# Patient Record
Sex: Female | Born: 1990
Health system: Southern US, Community
[De-identification: ages and names within clinical notes are randomized; demographics above are authoritative.]

## PROBLEM LIST (undated history)

## (undated) DIAGNOSIS — H109 Unspecified conjunctivitis: Secondary | ICD-10-CM

## (undated) DIAGNOSIS — K219 Gastro-esophageal reflux disease without esophagitis: Secondary | ICD-10-CM

## (undated) HISTORY — DX: Gastro-esophageal reflux disease without esophagitis: K21.9

## (undated) HISTORY — DX: Unspecified conjunctivitis: H10.9

---
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
## 2015-09-29 MED FILL — MONO-LINYAH 28 TABLET: 0.25-35 | 84 days supply | Qty: 112 | Fill #2

## 2016-01-13 MED FILL — MONO-LINYAH 28 TABLET: 0.25-35 | 84 days supply | Qty: 112 | Fill #3

## 2016-03-22 LAB — HM PAP SMEAR

## 2016-04-19 DIAGNOSIS — Z01419 Encounter for gynecological examination (general) (routine) without abnormal findings: Secondary | ICD-10-CM | POA: Diagnosis not present

## 2016-04-19 DIAGNOSIS — Z681 Body mass index (BMI) 19 or less, adult: Secondary | ICD-10-CM | POA: Diagnosis not present

## 2016-04-19 MED FILL — MONO-LINYAH 28 TABLET: 0.25-35 | 84 days supply | Qty: 112 | Fill #0

## 2016-08-12 MED FILL — MONO-LINYAH 28 TABLET: 0.25-35 | 84 days supply | Qty: 112 | Fill #1

## 2016-09-11 DIAGNOSIS — H10021 Other mucopurulent conjunctivitis, right eye: Secondary | ICD-10-CM | POA: Diagnosis not present

## 2016-09-21 DIAGNOSIS — H5202 Hypermetropia, left eye: Secondary | ICD-10-CM | POA: Diagnosis not present

## 2016-09-21 DIAGNOSIS — H5211 Myopia, right eye: Secondary | ICD-10-CM | POA: Diagnosis not present

## 2016-09-30 ENCOUNTER — Encounter: Payer: Self-pay | Admitting: Behavioral Health

## 2016-09-30 ENCOUNTER — Telehealth: Payer: Self-pay | Admitting: Behavioral Health

## 2016-09-30 NOTE — Telephone Encounter (Signed)
Pre-Visit Call completed with patient and chart updated.   Pre-Visit Info documented in Specialty Comments under SnapShot.    

## 2016-10-03 ENCOUNTER — Ambulatory Visit: Payer: Self-pay | Admitting: Family

## 2016-10-14 ENCOUNTER — Encounter: Payer: Self-pay | Admitting: Family

## 2016-10-14 ENCOUNTER — Ambulatory Visit (INDEPENDENT_AMBULATORY_CARE_PROVIDER_SITE_OTHER): Payer: 59 | Admitting: Family

## 2016-10-14 VITALS — BP 115/70 | HR 63 | Temp 98.4°F | Resp 16 | Ht 67.0 in | Wt 112.2 lb

## 2016-10-14 DIAGNOSIS — Z23 Encounter for immunization: Secondary | ICD-10-CM

## 2016-10-14 DIAGNOSIS — Z0000 Encounter for general adult medical examination without abnormal findings: Secondary | ICD-10-CM

## 2016-10-14 LAB — CBC WITH DIFFERENTIAL/PLATELET
BASOS PCT: 0.4 % (ref 0.0–3.0)
Basophils Absolute: 0 10*3/uL (ref 0.0–0.1)
EOS ABS: 0.1 10*3/uL (ref 0.0–0.7)
EOS PCT: 1.6 % (ref 0.0–5.0)
HEMATOCRIT: 36.9 % (ref 36.0–46.0)
HEMOGLOBIN: 12.6 g/dL (ref 12.0–15.0)
Lymphocytes Relative: 32.3 % (ref 12.0–46.0)
Lymphs Abs: 2.4 10*3/uL (ref 0.7–4.0)
MCHC: 34.1 g/dL (ref 30.0–36.0)
MCV: 93.2 fl (ref 78.0–100.0)
MONO ABS: 0.5 10*3/uL (ref 0.1–1.0)
Monocytes Relative: 6.3 % (ref 3.0–12.0)
Neutro Abs: 4.4 10*3/uL (ref 1.4–7.7)
Neutrophils Relative %: 59.4 % (ref 43.0–77.0)
Platelets: 336 10*3/uL (ref 150.0–400.0)
RBC: 3.95 Mil/uL (ref 3.87–5.11)
RDW: 12.6 % (ref 11.5–15.5)
WBC: 7.3 10*3/uL (ref 4.0–10.5)

## 2016-10-14 LAB — BASIC METABOLIC PANEL
BUN: 8 mg/dL (ref 6–23)
CHLORIDE: 106 meq/L (ref 96–112)
CO2: 28 mEq/L (ref 19–32)
Calcium: 9.3 mg/dL (ref 8.4–10.5)
Creatinine, Ser: 0.62 mg/dL (ref 0.40–1.20)
GFR: 122.89 mL/min (ref >60.00)
Glucose, Bld: 79 mg/dL (ref 70–99)
POTASSIUM: 4.1 meq/L (ref 3.5–5.1)
Sodium: 138 mEq/L (ref 135–145)

## 2016-10-14 LAB — HEPATIC FUNCTION PANEL
ALT: 9 U/L (ref 0–35)
AST: 13 U/L (ref 0–37)
Albumin: 4.2 g/dL (ref 3.5–5.2)
Alkaline Phosphatase: 41 U/L (ref 39–117)
BILIRUBIN DIRECT: 0.1 mg/dL (ref 0.0–0.3)
BILIRUBIN TOTAL: 0.8 mg/dL (ref 0.2–1.2)
Total Protein: 7.5 g/dL (ref 6.0–8.3)

## 2016-10-14 LAB — TSH: TSH: 1.17 u[IU]/mL (ref 0.35–4.50)

## 2016-10-14 LAB — LIPID PANEL
CHOL/HDL RATIO: 3
Cholesterol: 153 mg/dL (ref 0–200)
HDL: 55.1 mg/dL (ref >39.00)
LDL CALC: 86 mg/dL (ref 0–99)
NonHDL: 98.3
Triglycerides: 62 mg/dL (ref 0.0–149.0)
VLDL: 12.4 mg/dL (ref 0.0–40.0)

## 2016-10-14 NOTE — Progress Notes (Signed)
Subjective:    Patient ID: Kim Wheeler, female    DOB: [birth year], 26 y.o.   MRN: [id number]  HPI   Patient presents today for complete physical.  Immunizations:  Flu shot up to date.  Unsure of last tetanus. Diet:  Fair diet Exercise: does not exercise.  Pap Smear: 8/17 normal per patient sees Kim Wheeler        Review of Systems  Constitutional: Negative for unexpected weight change.  HENT: Negative for hearing loss and rhinorrhea.   Eyes: Negative for visual disturbance.  Respiratory: Negative for cough.   Cardiovascular: Negative for leg swelling.  Gastrointestinal: Negative for constipation and diarrhea.  Genitourinary: Negative for dysuria and frequency.  Musculoskeletal: Negative for arthralgias and myalgias.  Skin: Negative for rash.  Neurological: Negative for headaches.  Hematological: Negative for adenopathy.  Psychiatric/Behavioral:       Denies depression/anxiety   Past Medical History:  Diagnosis Date  . Conjunctivitis      Social History   Social History  . Marital status: Single    Spouse name: N/A  . Number of children: N/A  . Years of education: N/A   Occupational History  . Not on file.   Social History Main Topics  . Smoking status: Never Smoker  . Smokeless tobacco: Never Used  . Alcohol use Yes     Comment: Wine once a month   . Drug use: No  . Sexual activity: Not on file   Other Topics Concern  . 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 EYE SURGERY      Family History  Problem Relation Age of Onset  . Cancer Mother     breast cancer  . Hypertension Mother   . Gout Father   . Dementia Maternal Grandmother   . Stroke Maternal Grandmother   . Breast cancer Maternal Grandmother     No Known  Allergies  Current Outpatient Prescriptions on File Prior to Visit  Medication Sig Dispense Refill  . norgestimate-ethinyl estradiol (SPRINTEC 28) 0.25-35 MG-MCG tablet Take 1 tablet by mouth daily.     No current facility-administered medications on file prior to visit.     BP 115/70 (BP Location: Right Arm, Patient Position: Sitting, Cuff Size: Normal)   Pulse 63   Temp 98.4 F (36.9 C) (Oral)   Resp 16   Ht 5\' 7"  (1.702 m)   Wt 112 lb 3.2 oz (50.9 kg)   LMP 10/11/2016   SpO2 99%   BMI 17.57 kg/m       Objective:   Physical Exam  Physical Exam  Constitutional: She is oriented to person, place, and time. She appears well-developed and well-nourished. No distress.  HENT:  Head: Normocephalic and atraumatic.  Right Ear: Tympanic membrane and ear canal normal.  Left Ear: Tympanic membrane and ear canal normal.  Mouth/Throat: Oropharynx is clear and moist.  Eyes: Pupils are equal, round, and reactive to light. No scleral icterus.  Neck: Normal range of motion. No thyromegaly present.  Cardiovascular: Normal rate and regular rhythm.   No murmur heard. Pulmonary/Chest: Effort normal and breath sounds normal. No respiratory distress. He has no wheezes. She has no rales. She exhibits no tenderness.  Abdominal: Soft. Bowel sounds are normal. She exhibits no distension and no mass. There is no tenderness. There is no rebound and no guarding.  Musculoskeletal: She exhibits no edema.  Lymphadenopathy:    She has no cervical adenopathy.  Neurological: She is alert and oriented to person, place, and time. She has normal patellar reflexes. She exhibits normal muscle tone. Coordination normal.  Skin: Skin is warm and dry.  Psychiatric: She has a normal mood and affect. Her behavior is normal. Judgment and thought content normal.  Breast/pelvic: deferred            Assessment & Plan:          Assessment & Plan:  Preventative Care- discussed healthy diet, regular  exercise. Will give Tdap today.  Obtain routine lab work.

## 2016-10-14 NOTE — Progress Notes (Signed)
Pre visit review using our clinic review tool, if applicable. No additional management support is needed unless otherwise documented below in the visit note. 

## 2016-10-14 NOTE — Patient Instructions (Addendum)
Please add regular exercise. Complete lab work prior to leaving.

## 2016-10-21 ENCOUNTER — Encounter: Payer: Self-pay | Admitting: Family

## 2016-11-04 NOTE — Telephone Encounter (Signed)
Message sent to lab to check status of u/a from 10/14/16.

## 2016-11-04 NOTE — Telephone Encounter (Signed)
Did lab get urinalysis sample? If not, please ask pt to return at her convenience.

## 2016-12-02 MED FILL — MONO-LINYAH 28 TABLET: 0.25-35 | 84 days supply | Qty: 112 | Fill #2

## 2017-03-07 MED FILL — MONO-LINYAH 28 TABLET: 0.25-35 | 84 days supply | Qty: 112 | Fill #3

## 2017-06-26 DIAGNOSIS — Z681 Body mass index (BMI) 19 or less, adult: Secondary | ICD-10-CM | POA: Diagnosis not present

## 2017-06-26 DIAGNOSIS — Z01419 Encounter for gynecological examination (general) (routine) without abnormal findings: Secondary | ICD-10-CM | POA: Diagnosis not present

## 2017-07-21 MED FILL — NORG-ETHIN ESTRA 0.25-0.035: 0.25-35 | 84 days supply | Qty: 112 | Fill #0

## 2017-08-13 ENCOUNTER — Encounter: Payer: Self-pay | Admitting: Family

## 2017-08-18 ENCOUNTER — Telehealth: Payer: Self-pay

## 2017-08-18 DIAGNOSIS — Z111 Encounter for screening for respiratory tuberculosis: Secondary | ICD-10-CM

## 2017-08-18 NOTE — Addendum Note (Signed)
Addended byConrad Inman: Jasia Hiltunen D on: 08/18/2017 12:18 PM   Modules accepted: Orders

## 2017-08-18 NOTE — Telephone Encounter (Signed)
Please advise in PCP absence.  

## 2017-08-18 NOTE — Telephone Encounter (Signed)
OK to order quant gold test. Ty.

## 2017-08-18 NOTE — Telephone Encounter (Signed)
Order placed- Fleeta EmmerShanea will you call Pt to schedule lab appt please?

## 2017-08-18 NOTE — Telephone Encounter (Signed)
Copied from CRM [id number]. Topic: Appointment Scheduling - Scheduling Inquiry for Clinic >> Aug 18, 2017 11:27 AM Arlyss Gandyichardson, Taren N, NT wrote: Reason for CRM: Patient is needing a TB test done. She is requesting the blood draw TB test done. Needs order put in the appt scheduled. Please contact pt.

## 2017-08-21 ENCOUNTER — Telehealth: Payer: Self-pay | Admitting: Family

## 2017-08-21 ENCOUNTER — Other Ambulatory Visit (INDEPENDENT_AMBULATORY_CARE_PROVIDER_SITE_OTHER): Payer: 59

## 2017-08-21 DIAGNOSIS — Z0000 Encounter for general adult medical examination without abnormal findings: Secondary | ICD-10-CM

## 2017-08-21 DIAGNOSIS — Z111 Encounter for screening for respiratory tuberculosis: Secondary | ICD-10-CM | POA: Diagnosis not present

## 2017-08-21 NOTE — Telephone Encounter (Signed)
Notified pt. She is available on 09/01/16. Appt scheduled at 10am. Lab order entered for U/A. Pt denies any known colorblindness. I entered cpe dx for U/A. Please advise if there is another dx code you would like me to use?

## 2017-08-21 NOTE — Telephone Encounter (Signed)
Please contact pt and let her know that I reviewed her cpx form.  It is requiring UA, hearing, vision check.  We can schedule her for a nurse visit to complete these things and then I can complete her form.  Also need to confirm that she is not color blind.

## 2017-08-21 NOTE — Telephone Encounter (Signed)
That is fine. Thanks 

## 2017-08-23 LAB — QUANTIFERON-TB GOLD PLUS
NIL: 0.06 [IU]/mL
QuantiFERON-TB Gold Plus: NEGATIVE
TB2-NIL: <0 IU/mL

## 2017-09-01 ENCOUNTER — Ambulatory Visit (INDEPENDENT_AMBULATORY_CARE_PROVIDER_SITE_OTHER): Payer: 59

## 2017-09-01 DIAGNOSIS — Z020 Encounter for examination for admission to educational institution: Secondary | ICD-10-CM

## 2017-09-01 LAB — POCT URINALYSIS DIPSTICK
Bilirubin, UA: NEGATIVE
Blood, UA: 10
GLUCOSE UA: NEGATIVE
KETONES UA: NEGATIVE
Leukocytes, UA: NEGATIVE
NITRITE UA: NEGATIVE
PROTEIN UA: NEGATIVE
SPEC GRAV UA: 1.015 (ref 1.010–1.025)
Urobilinogen, UA: 0.2 E.U./dL
pH, UA: 6 (ref 5.0–8.0)

## 2017-09-01 NOTE — Progress Notes (Signed)
Pre visit review using our clinic tool,if applicable. No additional management support is needed unless otherwise documented below in the visit note.   Patient in to complete Physical Examination Form per M. Peggyann Juba'Sullivan.  Will complete hearing test,vision and U/A.  Hearing test--Passed Vision test--Left corrected 20/25 Right corrected 20/25 Color--Passed U/A-- Albumin- Negative Glucose- Negative     All results documented in chart

## 2017-09-27 DIAGNOSIS — H5202 Hypermetropia, left eye: Secondary | ICD-10-CM | POA: Diagnosis not present

## 2017-09-27 DIAGNOSIS — H5211 Myopia, right eye: Secondary | ICD-10-CM | POA: Diagnosis not present

## 2017-10-16 ENCOUNTER — Encounter: Payer: 59 | Admitting: Family

## 2017-10-17 ENCOUNTER — Ambulatory Visit (INDEPENDENT_AMBULATORY_CARE_PROVIDER_SITE_OTHER): Payer: 59 | Admitting: Family

## 2017-10-17 ENCOUNTER — Encounter: Payer: Self-pay | Admitting: Family

## 2017-10-17 VITALS — BP 114/66 | HR 59 | Resp 16 | Ht 67.0 in | Wt 114.0 lb

## 2017-10-17 DIAGNOSIS — Z0000 Encounter for general adult medical examination without abnormal findings: Secondary | ICD-10-CM | POA: Diagnosis not present

## 2017-10-17 LAB — HEPATIC FUNCTION PANEL
ALBUMIN: 4.3 g/dL (ref 3.5–5.2)
ALT: 13 U/L (ref 0–35)
AST: 14 U/L (ref 0–37)
Alkaline Phosphatase: 47 U/L (ref 39–117)
BILIRUBIN TOTAL: 1 mg/dL (ref 0.2–1.2)
Bilirubin, Direct: 0.2 mg/dL (ref 0.0–0.3)
TOTAL PROTEIN: 7.8 g/dL (ref 6.0–8.3)

## 2017-10-17 LAB — TSH: TSH: 2.03 u[IU]/mL (ref 0.35–4.50)

## 2017-10-17 LAB — CBC WITH DIFFERENTIAL/PLATELET
BASOS PCT: 0.3 % (ref 0.0–3.0)
Basophils Absolute: 0 10*3/uL (ref 0.0–0.1)
EOS PCT: 1.2 % (ref 0.0–5.0)
Eosinophils Absolute: 0.1 10*3/uL (ref 0.0–0.7)
HEMATOCRIT: 36.8 % (ref 36.0–46.0)
HEMOGLOBIN: 12.5 g/dL (ref 12.0–15.0)
Lymphocytes Relative: 29.3 % (ref 12.0–46.0)
Lymphs Abs: 2.2 10*3/uL (ref 0.7–4.0)
MCHC: 33.8 g/dL (ref 30.0–36.0)
MCV: 92.8 fl (ref 78.0–100.0)
MONOS PCT: 5.9 % (ref 3.0–12.0)
Monocytes Absolute: 0.4 10*3/uL (ref 0.1–1.0)
Neutro Abs: 4.8 10*3/uL (ref 1.4–7.7)
Neutrophils Relative %: 63.3 % (ref 43.0–77.0)
Platelets: 357 10*3/uL (ref 150.0–400.0)
RBC: 3.97 Mil/uL (ref 3.87–5.11)
RDW: 12.2 % (ref 11.5–15.5)
WBC: 7.6 10*3/uL (ref 4.0–10.5)

## 2017-10-17 LAB — LIPID PANEL
CHOLESTEROL: 144 mg/dL (ref 0–200)
HDL: 58.5 mg/dL (ref >39.00)
LDL CALC: 64 mg/dL (ref 0–99)
NonHDL: 85.01
Total CHOL/HDL Ratio: 2
Triglycerides: 103 mg/dL (ref 0.0–149.0)
VLDL: 20.6 mg/dL (ref 0.0–40.0)

## 2017-10-17 LAB — BASIC METABOLIC PANEL
BUN: 10 mg/dL (ref 6–23)
CHLORIDE: 104 meq/L (ref 96–112)
CO2: 29 mEq/L (ref 19–32)
Calcium: 9.9 mg/dL (ref 8.4–10.5)
Creatinine, Ser: 0.58 mg/dL (ref 0.40–1.20)
GFR: 131.73 mL/min (ref >60.00)
Glucose, Bld: 88 mg/dL (ref 70–99)
POTASSIUM: 4.2 meq/L (ref 3.5–5.1)
SODIUM: 138 meq/L (ref 135–145)

## 2017-10-17 NOTE — Progress Notes (Signed)
Subjective:    Patient ID: Kim Wheeler, female    DOB: Mar 24, 1991, 27 y.o.   MRN: [id number]  HPI  Patient presents today for complete physical.  Immunizations: flu shot this year, tetanus up to date Diet: needs more vegetables Exercise: light exercise Pap Smear: reports last pap was 2018 with her GYN- normal per patient Dental: due  Vision: last exam early February Wt Readings from Last 3 Encounters:  10/17/17 114 lb (51.7 kg)  10/14/16 112 lb 3.2 oz (50.9 kg)      Review of Systems  Constitutional: Negative for unexpected weight change.  HENT: Negative for hearing loss and rhinorrhea.   Eyes: Negative for visual disturbance.  Respiratory: Negative for cough.   Cardiovascular: Negative for leg swelling.  Gastrointestinal: Negative for blood in stool, constipation, diarrhea and nausea.  Genitourinary: Negative for dysuria, frequency, hematuria and menstrual problem.  Musculoskeletal: Negative for arthralgias and myalgias.  Skin: Negative for rash.  Neurological:       Reports some stress induced headaches,  Usually resolve with tylenol/ibuprofen  Hematological: Negative for adenopathy.  Psychiatric/Behavioral:       Denies depression/anxiety   Past Medical History:  Diagnosis Date  . Conjunctivitis      Social History   Socioeconomic History  . Marital status: Married    Spouse name: Not on file  . Number of children: Not on file  . Years of education: Not on file  . Highest education level: Not on file  Social Needs  . Financial resource strain: Not on file  . Food insecurity - worry: Not on file  . Food insecurity - inability: Not on file  . Transportation needs - medical: Not on file  . Transportation needs - non-medical: Not on file  Occupational History  . Not on file  Tobacco Use  . Smoking status: Never Smoker  . Smokeless tobacco: Never Used  Substance and Sexual Activity  . Alcohol use: Yes    Comment: Wine once a month   . Drug use: No    . Sexual activity: Not on file  Other Topics Concern  . 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 EYE SURGERY Bilateral 1993   strabismus     Family History  Problem Relation Age of Onset  . Cancer Mother        breast cancer  . Hypertension Mother   . Gout Father   . Dementia Maternal Grandmother   . Stroke Maternal Grandmother   . Breast cancer Maternal Grandmother     No Known Allergies  Current Outpatient Medications on File Prior to Visit  Medication Sig Dispense Refill  . norgestimate-ethinyl estradiol (SPRINTEC 28) 0.25-35 MG-MCG tablet Take 1 tablet by mouth daily.     No current facility-administered medications on file prior to visit.     BP 114/66 (BP Location: Right Arm, Patient Position: Sitting, Cuff Size: Normal)   Pulse (!) 59   Resp 16   Ht 5\' 7"  (1.702 m)   Wt 114 lb (51.7 kg)   SpO2 100%   BMI 17.85 kg/m       Objective:   Physical Exam  Physical Exam  Constitutional: She is oriented to person, place, and time.  She appears well-developed and well-nourished. No distress.  HENT:  Head: Normocephalic and atraumatic.  Right Ear: Tympanic membrane and ear canal normal.  Left Ear: Tympanic membrane and ear canal normal.  Mouth/Throat: Oropharynx is clear and moist.  Eyes: Pupils are equal, round, and reactive to light. No scleral icterus.  Neck: Normal range of motion. No thyromegaly present.  Cardiovascular: Normal rate and regular rhythm.   No murmur heard. Pulmonary/Chest: Effort normal and breath sounds normal. No respiratory distress. He has no wheezes. She has no rales. She exhibits no tenderness.  Abdominal: Soft. Bowel sounds are normal. She exhibits no distension and no mass. There is no tenderness. There is no rebound and no  guarding.  Musculoskeletal: She exhibits no edema.  Lymphadenopathy:    She has no cervical adenopathy.  Neurological: She is alert and oriented to person, place, and time. She has normal patellar reflexes. She exhibits normal muscle tone. Coordination normal.  Skin: Skin is warm and dry.  Psychiatric: She has flat affect. Eye contact is poor. Her behavior is normal. Judgment and thought content normal.          Assessment & Plan:  Preventive healthcare-we discussed continuing healthy diet and regular exercise.  Obtain routine lab work.  Pap smear and immunizations are up-to-date.       Assessment & Plan:

## 2017-10-17 NOTE — Patient Instructions (Signed)
Please complete lab work prior to leaving. Continue to work on healthy diet and regular exercise.  

## 2018-01-16 MED FILL — PREVIFEM 0.25-35 MG-MCG TAB: 0.25-35 | 84 days supply | Qty: 112 | Fill #1

## 2018-05-14 MED FILL — PREVIFEM 0.25-35 MG-MCG TAB: 0.25-35 | 84 days supply | Qty: 112 | Fill #2

## 2018-07-10 DIAGNOSIS — Z681 Body mass index (BMI) 19 or less, adult: Secondary | ICD-10-CM | POA: Diagnosis not present

## 2018-07-10 DIAGNOSIS — Z01419 Encounter for gynecological examination (general) (routine) without abnormal findings: Secondary | ICD-10-CM | POA: Diagnosis not present

## 2018-08-06 ENCOUNTER — Other Ambulatory Visit (INDEPENDENT_AMBULATORY_CARE_PROVIDER_SITE_OTHER): Payer: 59

## 2018-08-06 ENCOUNTER — Telehealth: Payer: Self-pay | Admitting: Family

## 2018-08-06 DIAGNOSIS — Z111 Encounter for screening for respiratory tuberculosis: Secondary | ICD-10-CM

## 2018-08-06 NOTE — Telephone Encounter (Signed)
Pt presents to lab for tb testing.

## 2018-08-08 LAB — QUANTIFERON-TB GOLD PLUS
Mitogen-NIL: >10 IU/mL
NIL: 0.02 [IU]/mL
QuantiFERON-TB Gold Plus: NEGATIVE
TB1-NIL: 0 IU/mL
TB2-NIL: 0 IU/mL

## 2018-09-18 ENCOUNTER — Encounter: Payer: Self-pay | Admitting: Family

## 2018-09-18 ENCOUNTER — Ambulatory Visit (INDEPENDENT_AMBULATORY_CARE_PROVIDER_SITE_OTHER): Payer: No Typology Code available for payment source | Admitting: Family

## 2018-09-18 VITALS — BP 124/71 | HR 91 | Temp 98.7°F | Resp 16 | Ht 67.0 in | Wt 121.0 lb

## 2018-09-18 DIAGNOSIS — Z0000 Encounter for general adult medical examination without abnormal findings: Secondary | ICD-10-CM | POA: Diagnosis not present

## 2018-09-18 LAB — LIPID PANEL
CHOLESTEROL: 156 mg/dL (ref 0–200)
HDL: 48.6 mg/dL (ref >39.00)
LDL Cholesterol: 91 mg/dL (ref 0–99)
NonHDL: 107.73
Total CHOL/HDL Ratio: 3
Triglycerides: 82 mg/dL (ref 0.0–149.0)
VLDL: 16.4 mg/dL (ref 0.0–40.0)

## 2018-09-18 LAB — HEPATIC FUNCTION PANEL
ALT: 16 U/L (ref 0–35)
AST: 16 U/L (ref 0–37)
Albumin: 4.7 g/dL (ref 3.5–5.2)
Alkaline Phosphatase: 51 U/L (ref 39–117)
BILIRUBIN TOTAL: 1.4 mg/dL — AB (ref 0.2–1.2)
Bilirubin, Direct: 0.2 mg/dL (ref 0.0–0.3)
Total Protein: 7.7 g/dL (ref 6.0–8.3)

## 2018-09-18 LAB — CBC WITH DIFFERENTIAL/PLATELET
Basophils Absolute: 0 10*3/uL (ref 0.0–0.1)
Basophils Relative: 0.5 % (ref 0.0–3.0)
Eosinophils Absolute: 0.1 10*3/uL (ref 0.0–0.7)
Eosinophils Relative: 1 % (ref 0.0–5.0)
HCT: 40.2 % (ref 36.0–46.0)
Hemoglobin: 13.6 g/dL (ref 12.0–15.0)
Lymphocytes Relative: 25.1 % (ref 12.0–46.0)
Lymphs Abs: 2 10*3/uL (ref 0.7–4.0)
MCHC: 33.8 g/dL (ref 30.0–36.0)
MCV: 92.5 fl (ref 78.0–100.0)
Monocytes Absolute: 0.6 10*3/uL (ref 0.1–1.0)
Monocytes Relative: 7.1 % (ref 3.0–12.0)
Neutro Abs: 5.3 10*3/uL (ref 1.4–7.7)
Neutrophils Relative %: 66.3 % (ref 43.0–77.0)
Platelets: 349 10*3/uL (ref 150.0–400.0)
RBC: 4.35 Mil/uL (ref 3.87–5.11)
RDW: 12.2 % (ref 11.5–15.5)
WBC: 8 10*3/uL (ref 4.0–10.5)

## 2018-09-18 LAB — BASIC METABOLIC PANEL
BUN: 10 mg/dL (ref 6–23)
CHLORIDE: 106 meq/L (ref 96–112)
CO2: 28 meq/L (ref 19–32)
CREATININE: 0.72 mg/dL (ref 0.40–1.20)
Calcium: 9.9 mg/dL (ref 8.4–10.5)
GFR: 95.94 mL/min (ref >60.00)
Glucose, Bld: 78 mg/dL (ref 70–99)
Potassium: 3.8 mEq/L (ref 3.5–5.1)
SODIUM: 140 meq/L (ref 135–145)

## 2018-09-18 LAB — TSH: TSH: 1.39 u[IU]/mL (ref 0.35–4.50)

## 2018-09-18 NOTE — Progress Notes (Signed)
Subjective:    Patient ID: Kim Wheeler, female    DOB: 08/27/1990, 28 y.o.   MRN: [id number]  HPI  Patient presents today for complete physical.  Immunizations: tetanus 2018, flu shot up to date Diet: healthy Exercise: 2-3 days a week Pap Smear: up to date= sees fogelman Dental: overdue Vision: last year plans to schedule follow up  Wt Readings from Last 3 Encounters:  09/18/18 121 lb (54.9 kg)  10/17/17 114 lb (51.7 kg)  10/14/16 112 lb 3.2 oz (50.9 kg)         Review of Systems  Constitutional: Positive for fatigue. Negative for unexpected weight change.  HENT: Negative for rhinorrhea.   Respiratory: Negative for cough.   Cardiovascular: Negative for leg swelling.  Gastrointestinal: Negative for blood in stool, constipation and diarrhea.  Genitourinary: Negative for dysuria, frequency and menstrual problem.  Musculoskeletal: Negative for arthralgias and myalgias.  Skin: Negative for rash.  Neurological: Positive for headaches.       HA's during menstrual cycle, uses tylenol+ ibuprofen  Hematological: Negative for adenopathy.  Psychiatric/Behavioral:       Denies depression/anxiety   Past Medical History:  Diagnosis Date  . Conjunctivitis      Social History   Socioeconomic History  . Marital status: Married    Spouse name: Not on file  . Number of children: Not on file  . Years of education: Not on file  . Highest education level: Not on file  Occupational History  . Not on file  Social Needs  . Financial resource strain: Not on file  . Food insecurity:    Worry: Not on file    Inability: Not on file  . Transportation needs:    Medical: Not on file    Non-medical: Not on file  Tobacco Use  . Smoking status: Never Smoker  . Smokeless tobacco: Never Used  Substance and Sexual Activity  . Alcohol use: Yes    Comment: Wine once a month   . Drug use: No  . Sexual activity: Not on file  Lifestyle  . Physical activity:    Days per week: Not on  file    Minutes per session: Not on file  . Stress: Not on file  Relationships  . Social connections:    Talks on phone: Not on file    Gets together: Not on file    Attends religious service: Not on file    Active member of club or organization: Not on file    Attends meetings of clubs or organizations: Not on file    Relationship status: Not on file  . Intimate partner violence:    Fear of current or ex partner: Not on file    Emotionally abused: Not on file    Physically abused: Not on file    Forced sexual activity: Not on file  Other Topics Concern  . 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 EYE SURGERY Bilateral 1993   strabismus     Family History  Problem Relation Age of Onset  . Cancer Mother        breast cancer  . Hypertension Mother   . Gout Father   . Dementia Maternal Grandmother   .  Stroke Maternal Grandmother   . Breast cancer Maternal Grandmother     No Known Allergies  Current Outpatient Medications on File Prior to Visit  Medication Sig Dispense Refill  . norgestimate-ethinyl estradiol (SPRINTEC 28) 0.25-35 MG-MCG tablet Take 1 tablet by mouth daily.     No current facility-administered medications on file prior to visit.     BP 124/71 (BP Location: Right Arm, Patient Position: Sitting, Cuff Size: Small)   Pulse 91   Temp 98.7 F (37.1 C) (Oral)   Resp 16   Ht 5\' 7"  (1.702 m)   Wt 121 lb (54.9 kg)   LMP 08/28/2018   SpO2 100%   BMI 18.95 kg/m       Objective:   Physical Exam Physical Exam  Constitutional: She is oriented to person, place, and time. She appears well-developed and well-nourished. No distress.  HENT:  Head: Normocephalic and atraumatic.  Right Ear: Tympanic membrane and ear canal normal.  Left Ear: Tympanic  membrane and ear canal normal.  Mouth/Throat: Oropharynx is clear and moist.  Eyes: Pupils are equal, round, and reactive to light. No scleral icterus.  Neck: Normal range of motion. No thyromegaly present.  Cardiovascular: Normal rate and regular rhythm.   No murmur heard. Pulmonary/Chest: Effort normal and breath sounds normal. No respiratory distress. He has no wheezes. She has no rales. She exhibits no tenderness.  Abdominal: Soft. Bowel sounds are normal. She exhibits no distension and no mass. There is no tenderness. There is no rebound and no guarding.  Musculoskeletal: She exhibits no edema.  Lymphadenopathy:    She has no cervical adenopathy.  Neurological: She is alert and oriented to person, place, and time. She has normal patellar reflexes. She exhibits normal muscle tone. Coordination normal.  Skin: Skin is warm and dry.  Psychiatric: She has a normal mood and affect. Her behavior is normal. Judgment and thought content normal.     Assessment & Plan:   Preventative care- discussed continuing healthy diet and regular exercise. She has gained some weight but weight was a bit low so I think her weight gain is OK. Pap and immunizations are up to date. Will obtain routine lab work.      Assessment & Plan:

## 2018-09-18 NOTE — Patient Instructions (Addendum)
Please schedule routine dental exam. Continue healthy diet and exercise.

## 2018-09-19 LAB — URINALYSIS, ROUTINE W REFLEX MICROSCOPIC
Bilirubin Urine: NEGATIVE
Hgb urine dipstick: NEGATIVE
Ketones, ur: NEGATIVE
Leukocytes, UA: NEGATIVE
Nitrite: NEGATIVE
RBC / HPF: NONE SEEN (ref 0–?)
Specific Gravity, Urine: 1.01 (ref 1.000–1.030)
Total Protein, Urine: NEGATIVE
URINE GLUCOSE: NEGATIVE
Urobilinogen, UA: 0.2 (ref 0.0–1.0)
pH: 7 (ref 5.0–8.0)

## 2018-10-30 ENCOUNTER — Encounter: Payer: Self-pay | Admitting: Family Medicine

## 2018-10-30 ENCOUNTER — Ambulatory Visit (INDEPENDENT_AMBULATORY_CARE_PROVIDER_SITE_OTHER): Payer: Self-pay | Admitting: Family Medicine

## 2018-10-30 VITALS — BP 100/62 | HR 77 | Temp 99.1°F | Resp 16 | Wt 120.2 lb

## 2018-10-30 DIAGNOSIS — B354 Tinea corporis: Secondary | ICD-10-CM

## 2018-10-30 MED ORDER — CLOTRIMAZOLE 1 % EX CREA: 1.0000 "application " | TOPICAL_CREAM | Freq: Two times a day (BID) | CUTANEOUS | 0 refills | Status: AC

## 2018-10-30 NOTE — Progress Notes (Signed)
Kim Wheeler is a 28 y.o. female who presents today with 30 days of rash under her neck, patient has attempted treatments of topical steroid for one week that did not improve area but caused burning and then patient reports using neosporin antibiotic cream with some improvement in color but not in size. She has not really seen any overall improvement in this condition. She does have a PCP who manages her and reports that she is healthy without chronic health conditions or daily medications outside of birth control.   Review of Systems  Constitutional: Negative for chills, fever and malaise/fatigue.  HENT: Negative for congestion, ear discharge, ear pain, sinus pain and sore throat.   Eyes: Negative.   Respiratory: Negative for cough, sputum production and shortness of breath.   Cardiovascular: Negative.  Negative for chest pain.  Gastrointestinal: Negative for abdominal pain, diarrhea, nausea and vomiting.  Genitourinary: Negative for dysuria, frequency, hematuria and urgency.  Musculoskeletal: Negative for myalgias.  Skin: Positive for itching and rash.  Neurological: Negative for headaches.  Endo/Heme/Allergies: Negative.   Psychiatric/Behavioral: Negative.     Kim Wheeler has a current medication list which includes the following prescription(s): norgestimate-ethinyl estradiol and clotrimazole. Also has No Known Allergies.  Kim Wheeler  has a past medical history of Conjunctivitis. Also  has a past surgical history that includes Eye surgery (Bilateral, 1993).    O: Vitals:   10/30/18 1827  BP: 100/62  Pulse: 77  Resp: 16  Temp: 99.1 F (37.3 C)  SpO2: 98%     Physical Exam Vitals signs reviewed.  Constitutional:      Appearance: She is well-developed. She is not toxic-appearing.  HENT:     Head: Normocephalic.     Right Ear: Hearing, tympanic membrane, ear canal and external ear normal.     Left Ear: Hearing, tympanic membrane, ear canal and external ear normal.     Nose: Nose  normal.     Mouth/Throat:     Pharynx: Uvula midline.  Neck:     Musculoskeletal: Normal range of motion and neck supple.      Comments: Two dime sized flat round well circumscribed raised scaly lesions consistent with tinea present. No excoriation or evidence of broken skin or streaking- did not respond under UV light in office. No evidence of crusting or colored discharged- non fluctuant and dry. Cardiovascular:     Rate and Rhythm: Normal rate and regular rhythm.     Pulses: Normal pulses.     Heart sounds: Normal heart sounds.  Pulmonary:     Effort: Pulmonary effort is normal.     Breath sounds: Normal breath sounds.  Abdominal:     General: Bowel sounds are normal.     Palpations: Abdomen is soft.  Musculoskeletal: Normal range of motion.  Lymphadenopathy:     Head:     Right side of head: No submental or submandibular adenopathy.     Left side of head: No submental or submandibular adenopathy.     Cervical: No cervical adenopathy.  Skin:    Findings: Erythema and rash present. Rash is crusting and scaling.     Comments: See lesion description in neck PE  Neurological:     Mental Status: She is alert and oriented to person, place, and time.    A: 1. Tinea corporis    P: Skin rash consistent with tinea corporis will treat with first line azole cream AAA BID x 14 days (at least) or until clear + an additional 5  days. Advised patient to use plain dove bar soap for the next 30 days. Follow up if unimproved must be with PCP or urgent care with expanded lab capabilities. Patient v/u- all questions answered.  1. Tinea corporis - clotrimazole (LOTRIMIN) 1 % cream; Apply 1 application topically 2 (two) times daily for 14 days.   Discussed with patient exam findings, suspected diagnosis etiology and  reviewed recommended treatment plan and follow up, including complications and indications for urgent medical follow up and evaluation. Medications including use and indications  reviewed with patient. Patient provided relevant patient education on diagnosis and/or relevant related condition that were discussed and reviewed with patient at discharge. Patient verbalized understanding of information provided and agrees with plan of care (POC), all questions answered.

## 2018-10-30 NOTE — Patient Instructions (Signed)

## 2018-10-31 MED FILL — CLOTRIMAZOLE 1% CREAM: 1 | 14 days supply | Qty: 28 | Fill #0

## 2018-10-31 MED FILL — PREVIFEM 0.25-35 MG-MCG TAB: 0.25-35 | 84 days supply | Qty: 112 | Fill #0

## 2018-11-01 ENCOUNTER — Telehealth: Payer: Self-pay

## 2018-11-01 NOTE — Telephone Encounter (Signed)
Called and lm on pt vm tcb regarding how pt is feeling since her visit with [place].

## 2018-11-24 ENCOUNTER — Telehealth: Payer: No Typology Code available for payment source | Admitting: Physician Assistant

## 2018-11-24 DIAGNOSIS — Z20822 Contact with and (suspected) exposure to covid-19: Secondary | ICD-10-CM

## 2018-11-24 DIAGNOSIS — R6889 Other general symptoms and signs: Secondary | ICD-10-CM

## 2018-11-24 DIAGNOSIS — Z20828 Contact with and (suspected) exposure to other viral communicable diseases: Secondary | ICD-10-CM

## 2018-11-24 NOTE — Progress Notes (Signed)
E-Visit for Corona Virus Screening  Based on your current symptoms, you may very well have the virus, however your symptoms are mild. Currently, not all patients are being tested. If the symptoms are mild and there is not a known exposure, performing the test is not indicated.  Coronavirus disease 2019 (COVID-19) is a respiratory illness that can spread from person to person. The virus that causes COVID-19 is a new virus that was first identified in the country of [place] but is now found in multiple other countries and has spread to the [place].  Symptoms associated with the virus are mild to severe fever, cough, and shortness of breath. There is currently no vaccine to protect against COVID-19, and there is no specific antiviral treatment for the virus.   To be considered HIGH RISK for Coronavirus (COVID-19), you have to meet the following criteria:  . Traveled to [place], [place], [place], [place] or [place]; or in the [place] to [place], [place], [place], or [place]; and have fever, cough, and shortness of breath within the last 2 weeks of travel OR  . Been in close contact with a person diagnosed with COVID-19 within the last 2 weeks and have fever, cough, and shortness of breath  . IF YOU DO NOT MEET THESE CRITERIA, YOU ARE CONSIDERED LOW RISK FOR COVID-19.   It is vitally important that if you feel that you have an infection such as this virus or any other virus that you stay home and away from places where you may spread it to others.  You should self-quarantine for 14 days if you have symptoms that could potentially be coronavirus and avoid contact with people age 31 and older.   You can use medication such as Delsym for any cough or congestion  You may also take acetaminophen (Tylenol) as needed for fever.   Reduce your risk of any infection by using the same precautions used for avoiding the common cold or flu:  Marland Kitchen Wash your hands often with soap and warm water for at  least 20 seconds.  If soap and water are not readily available, use an alcohol-based hand sanitizer with at least 60% alcohol.  . If coughing or sneezing, cover your mouth and nose by coughing or sneezing into the elbow areas of your shirt or coat, into a tissue or into your sleeve (not your hands). . Avoid shaking hands with others and consider head nods or verbal greetings only. . Avoid touching your eyes, nose, or mouth with unwashed hands.  . Avoid close contact with people who are sick. . Avoid places or events with large numbers of people in one location, like concerts or sporting events. . Carefully consider travel plans you have or are making. . If you are planning any travel outside or inside the [place], visit the CDC's Travelers' Health webpage for the latest health notices. . If you have some symptoms but not all symptoms, continue to monitor at home and seek medical attention if your symptoms worsen. . If you are having a medical emergency, call 911.  HOME CARE . Only take medications as instructed by your medical team. . Drink plenty of fluids and get plenty of rest. . A steam or ultrasonic humidifier can help if you have congestion.   GET HELP RIGHT AWAY IF: . You develop worsening fever. . You become short of breath . You cough up blood. . Your symptoms become more severe MAKE SURE YOU   Understand these  instructions.  Will watch your condition.  Will get help right away if you are not doing well or get worse.  Your e-visit answers were reviewed by a board certified advanced clinical practitioner to complete your personal care plan.  Depending on the condition, your plan could have included both over the counter or prescription medications.  If there is a problem please reply once you have received a response from your provider. Your safety is important to [place].  If you have drug allergies check your prescription carefully.    You can use MyChart to ask questions about  today's visit, request a non-urgent call back, or ask for a work or school excuse for 24 hours related to this e-Visit. If it has been greater than 24 hours you will need to follow up with your provider, or enter a new e-Visit to address those concerns. You will get an e-mail in the next two days asking about your experience.  I hope that your e-visit has been valuable and will speed your recovery. Thank you for using e-visits.

## 2018-11-24 NOTE — Progress Notes (Signed)
I have spent 5 minutes in review of e-visit questionnaire, review and updating patient chart, medical decision making and response to patient.   Kemberly Taves Cody Graciela Plato, PA-C    

## 2018-11-28 ENCOUNTER — Encounter: Payer: Self-pay | Admitting: Family

## 2018-12-03 ENCOUNTER — Ambulatory Visit (INDEPENDENT_AMBULATORY_CARE_PROVIDER_SITE_OTHER): Payer: No Typology Code available for payment source | Admitting: Family

## 2018-12-03 ENCOUNTER — Ambulatory Visit (HOSPITAL_BASED_OUTPATIENT_CLINIC_OR_DEPARTMENT_OTHER)
Admission: RE | Admit: 2018-12-03 | Discharge: 2018-12-03 | Disposition: A | Discharge status: Home / Self Care | Payer: No Typology Code available for payment source | Source: Ambulatory Visit | Attending: Family | Admitting: Family

## 2018-12-03 ENCOUNTER — Encounter: Payer: Self-pay | Admitting: *Deleted

## 2018-12-03 ENCOUNTER — Telehealth: Payer: Self-pay | Admitting: Family

## 2018-12-03 ENCOUNTER — Other Ambulatory Visit: Payer: Self-pay

## 2018-12-03 DIAGNOSIS — R0602 Shortness of breath: Secondary | ICD-10-CM

## 2018-12-03 LAB — D-DIMER, QUANTITATIVE: D-Dimer, Quant: <0.19 mcg/mL FEU (ref <0.50)

## 2018-12-03 NOTE — Telephone Encounter (Signed)
'  Kim Wheeler' with [facility] calling to report D-Dimer drawn this afternoon <0.19. States will Fax to practice. Results in Epic

## 2018-12-03 NOTE — Telephone Encounter (Signed)
This encounter was created in error - please disregard.

## 2018-12-03 NOTE — Progress Notes (Signed)
Virtual Visit via Video Note  I connected with Kim Wheeler on 12/03/18 at  2:40 PM EDT by a video enabled telemedicine application and verified that I am speaking with the correct person using two identifiers. This visit type was conducted due to national recommendations for restrictions regarding the COVID-19 Pandemic (e.g. social distancing).  This format is felt to be most appropriate for this patient at this time.   I discussed the limitations of evaluation and management by telemedicine and the availability of in person appointments. The patient expressed understanding and agreed to proceed.  Only the patient and myself were on today's video visit. The patient was at home and I was at home at the time of today's visit.   History of Present Illness:  Patient is a 28 yr old female who presents with c/o SOB.  Reports she first noticed while eating breakfast on 11/24/18. Reports that she had some low grade fever and chills that day.  T max was 99.2 that day. Sunday 4/5 she felt good.  Mild symptoms persisted last week. Pt reports breathing feels a little tight currently. Describes breathing as heavy.  Deniesassociated calf pain or swelling or recent travel.  Denies family hx of blood clot.    Denies wheezing or coughing.  Reports that she listened to her lungs and her lung sounds are diminished at the bases bilaterally.   Observations/Objective   Gen: Awake, alert, no acute distress Resp: Breathing is even and non-labored Psych: calm/pleasant demeanor Neuro: Alert and Oriented x 3, + facial symmetry, speech is clear.    Assessment and Plan:  SOB- D dimer is performed to rule out PE and is negative. CXR is performed to rule out PNA.  As of 4/15 she reports receiving negative COVID-19 results from her employer.  Will give trial of albuterol prn. Pt is advised to call if new/worsening symptoms. Pt verbalizes understanding.   Follow Up Instructions:    I discussed the assessment and  treatment plan with the patient. The patient was provided an opportunity to ask questions and all were answered. The patient agreed with the plan and demonstrated an understanding of the instructions.   The patient was advised to call back or seek an in-person evaluation if the symptoms worsen or if the condition fails to improve as anticipated.    Lemont Fillers, NP

## 2018-12-04 ENCOUNTER — Telehealth: Payer: Self-pay | Admitting: Family

## 2018-12-04 MED ORDER — ALBUTEROL SULFATE HFA 108 (90 BASE) MCG/ACT IN AERS: 2.0000 | INHALATION_SPRAY | Freq: Four times a day (QID) | RESPIRATORY_TRACT | 0 refills | Status: DC | PRN

## 2018-12-04 MED FILL — ALBUTEROL SULFATE HFA 108 (: 108 (90 BAS | 25 days supply | Qty: 18 | Fill #0

## 2018-12-04 NOTE — Telephone Encounter (Signed)
Reviewed

## 2018-12-04 NOTE — Telephone Encounter (Signed)
Advised pt Neg cxr, neg d dimer. Will send rx for prn albuterol for her use. Advised pt to call if new/worsening symptoms. Pt verbalizes understanding. She also tells me that covid testing through her employer is negative.

## 2018-12-05 ENCOUNTER — Telehealth: Payer: Self-pay | Admitting: *Deleted

## 2018-12-05 NOTE — Telephone Encounter (Signed)
Received Lab Report results from [facility]; forwarded to provider/SLS 04/15   

## 2019-01-31 ENCOUNTER — Encounter: Payer: Self-pay | Admitting: Family

## 2019-02-01 ENCOUNTER — Ambulatory Visit (INDEPENDENT_AMBULATORY_CARE_PROVIDER_SITE_OTHER): Payer: No Typology Code available for payment source | Admitting: Family

## 2019-02-01 ENCOUNTER — Other Ambulatory Visit: Payer: Self-pay

## 2019-02-01 DIAGNOSIS — R05 Cough: Secondary | ICD-10-CM | POA: Diagnosis not present

## 2019-02-01 DIAGNOSIS — R059 Cough, unspecified: Secondary | ICD-10-CM

## 2019-02-01 MED ORDER — GUAIFENESIN-CODEINE 100-10 MG/5ML PO SYRP: 5.0000 mL | ORAL_SOLUTION | Freq: Three times a day (TID) | ORAL | 0 refills | Status: DC | PRN

## 2019-02-01 MED ORDER — AZITHROMYCIN 250 MG PO TABS: ORAL_TABLET | ORAL | 0 refills | Status: DC

## 2019-02-01 MED FILL — GUAIATUSSIN AC LIQUID: 100-10 | 5 days supply | Qty: 75 | Fill #0

## 2019-02-01 MED FILL — AZITHROMYCIN 250 MG TABLET: 250 | 5 days supply | Qty: 6 | Fill #0

## 2019-02-01 NOTE — Progress Notes (Signed)
Virtual Visit via Video Note  I connected with Kim Wheeler on 02/01/19 at  2:00 PM EDT by a video enabled telemedicine application and verified that I am speaking with the correct person using two identifiers.  Location: Patient: home Provider: home   I discussed the limitations of evaluation and management by telemedicine and the availability of in person appointments. The patient expressed understanding and agreed to proceed.  History of Present Illness:  Patient is a 28 yr old female who presents today with chief complaint of cough.    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 Reports that there was an employee who tested positive in the end of May for COVID-19 but she did not have known contact with this person. She has been physically going in to work each day but performing virtual care for patients.   She reports that cough started Monday 01/28/19.  Reports that she had a coughing attack with gagging that occurred at work. They were "moving things around" the office at that time. Reports that her breathing felt tight.   Reports cough is worst at night and is keeping her up. Reports that she sleeps for 30-40 minutes and then wakes up coughing.  She denies wheezing. No history of asthma. Reports mild hx of seasonal allergies.   She denies associated fevers.  Occasional HA this week which she attributed to her period. Denies soar throat. Denies sob.  She reports that she has been going into work.   Has tried claritin, zyrtec, albuterol and delsym no improvement.   Past Medical History:  Diagnosis Date  . Conjunctivitis      Social History   Socioeconomic History  . Marital status: Married    Spouse name: Not on file  . Number of children: Not on file  . Years of education: Not on file  . Highest education level: Not on file  Occupational History  . Not on file  Social Needs  . Financial resource strain: Not on file  . Food insecurity   Worry: Not on file    Inability: Not on file  . Transportation needs    Medical: Not on file    Non-medical: Not on file  Tobacco Use  . Smoking status: Never Smoker  . Smokeless tobacco: Never Used  Substance and Sexual Activity  . Alcohol use: Yes    Comment: Wine once a month   . Drug use: No  . Sexual activity: Not on file  Lifestyle  . Physical activity    Days per week: Not on file    Minutes per session: Not on file  . Stress: Not on file  Relationships  . Social Musicianconnections    Talks on phone: Not on file    Gets together: Not on file    Attends religious service: Not on file    Active member of club or organization: Not on file    Attends meetings of clubs or organizations: Not on file    Relationship status: Not on file  . Intimate partner violence    Fear of current or ex partner: Not on file    Emotionally abused: Not on file    Physically abused: Not on file    Forced sexual activity: Not on file  Other Topics Concern  . 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 EYE SURGERY Bilateral 1993   strabismus     Family History  Problem Relation Age of Onset  . Cancer Mother        breast cancer  . Hypertension Mother   . Gout Father   . Dementia Maternal Grandmother   . Stroke Maternal Grandmother   . Breast cancer Maternal Grandmother     No Known Allergies  Current Outpatient Medications on File Prior to Visit  Medication Sig Dispense Refill  . albuterol (PROVENTIL HFA;VENTOLIN HFA) 108 (90 Base) MCG/ACT inhaler Inhale 2 puffs into the lungs every 6 (six) hours as needed for wheezing or shortness of breath. 1 Inhaler 0  . norgestimate-ethinyl estradiol (SPRINTEC 28) 0.25-35 MG-MCG tablet Take 1 tablet by mouth daily.     No current facility-administered medications  on file prior to visit.     There were no vitals taken for this visit.     Observations/Objective:  Gen: Awake, alert, no acute distress Resp: Breathing is even and non-labored Psych: calm/pleasant demeanor Neuro: Alert and Oriented x 3, + facial symmetry, speech is clear.   Assessment and Plan:  Cough-given the severity of her cough I would like to treat her empirically with azithromycin.  Will give Cheratussin as needed.  I suggested that she contact health network to let them know that she is having a severe cough so that they can facilitate COVID testing through her employer.  She will let me know if she has any issues.  I have advised her to call and let me know if symptoms worsen or if they fail to improve.  I have not also advised her to self quarantine until COVID results are reviewed. Follow Up Instructions:    I discussed the assessment and treatment plan with the patient. The patient was provided an opportunity to ask questions and all were answered. The patient agreed with the plan and demonstrated an understanding of the instructions.   The patient was advised to call back or seek an in-person evaluation if the symptoms worsen or if the condition fails to improve as anticipated.  Nance Pear, NP

## 2019-02-01 NOTE — Telephone Encounter (Signed)
Scheduled for virtual visit today ?

## 2019-02-01 NOTE — Telephone Encounter (Signed)
Please contact pt to arrange a virtual visit today.

## 2019-02-06 ENCOUNTER — Encounter: Payer: Self-pay | Admitting: Family

## 2019-07-25 IMAGING — DX CHEST - 2 VIEW
2 series · 2 of 2 positions shown · non-contrast
Comparison: None.

CLINICAL DATA: Short of breath for 1 week

EXAM:
CHEST - 2 VIEW

[chest pa]
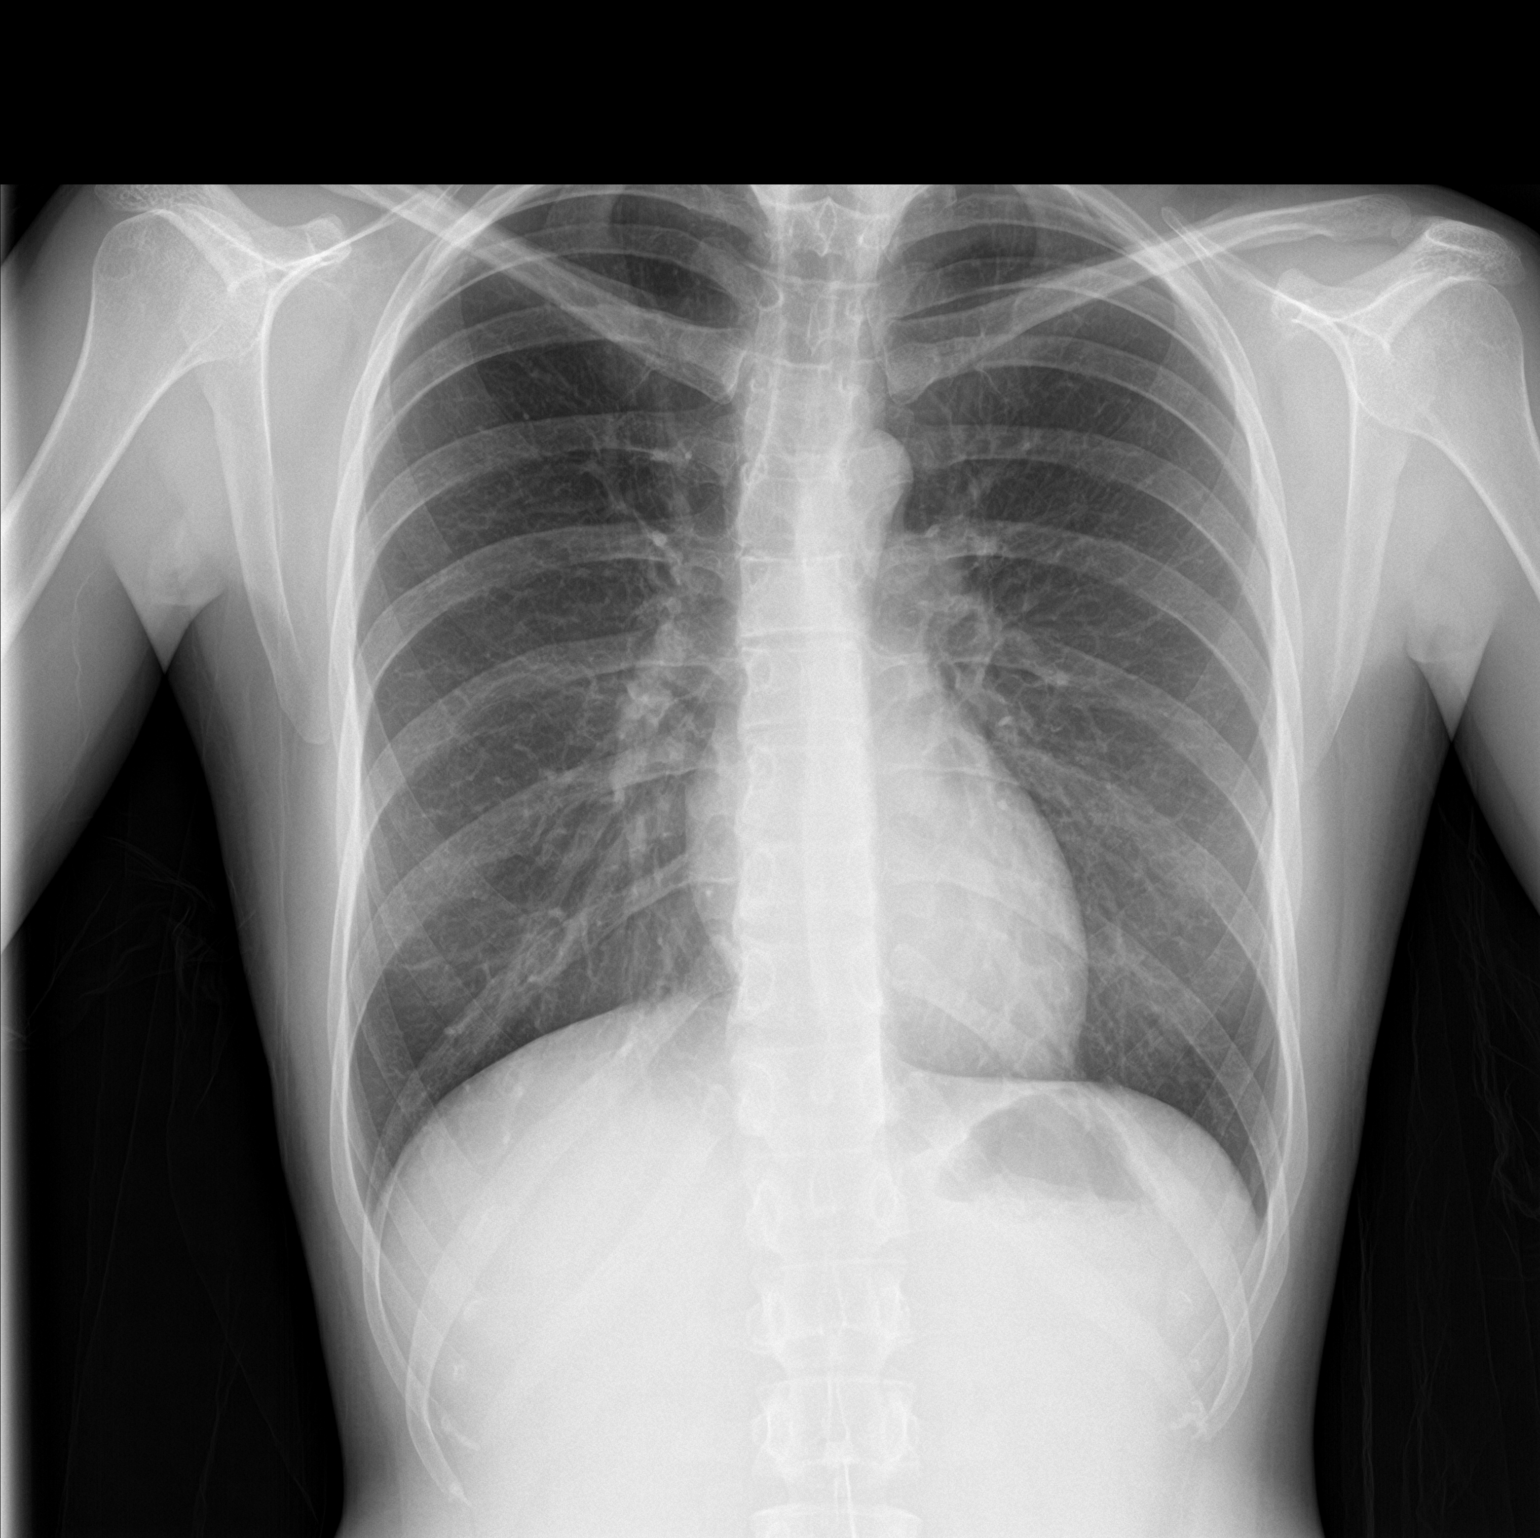

[chest lat]
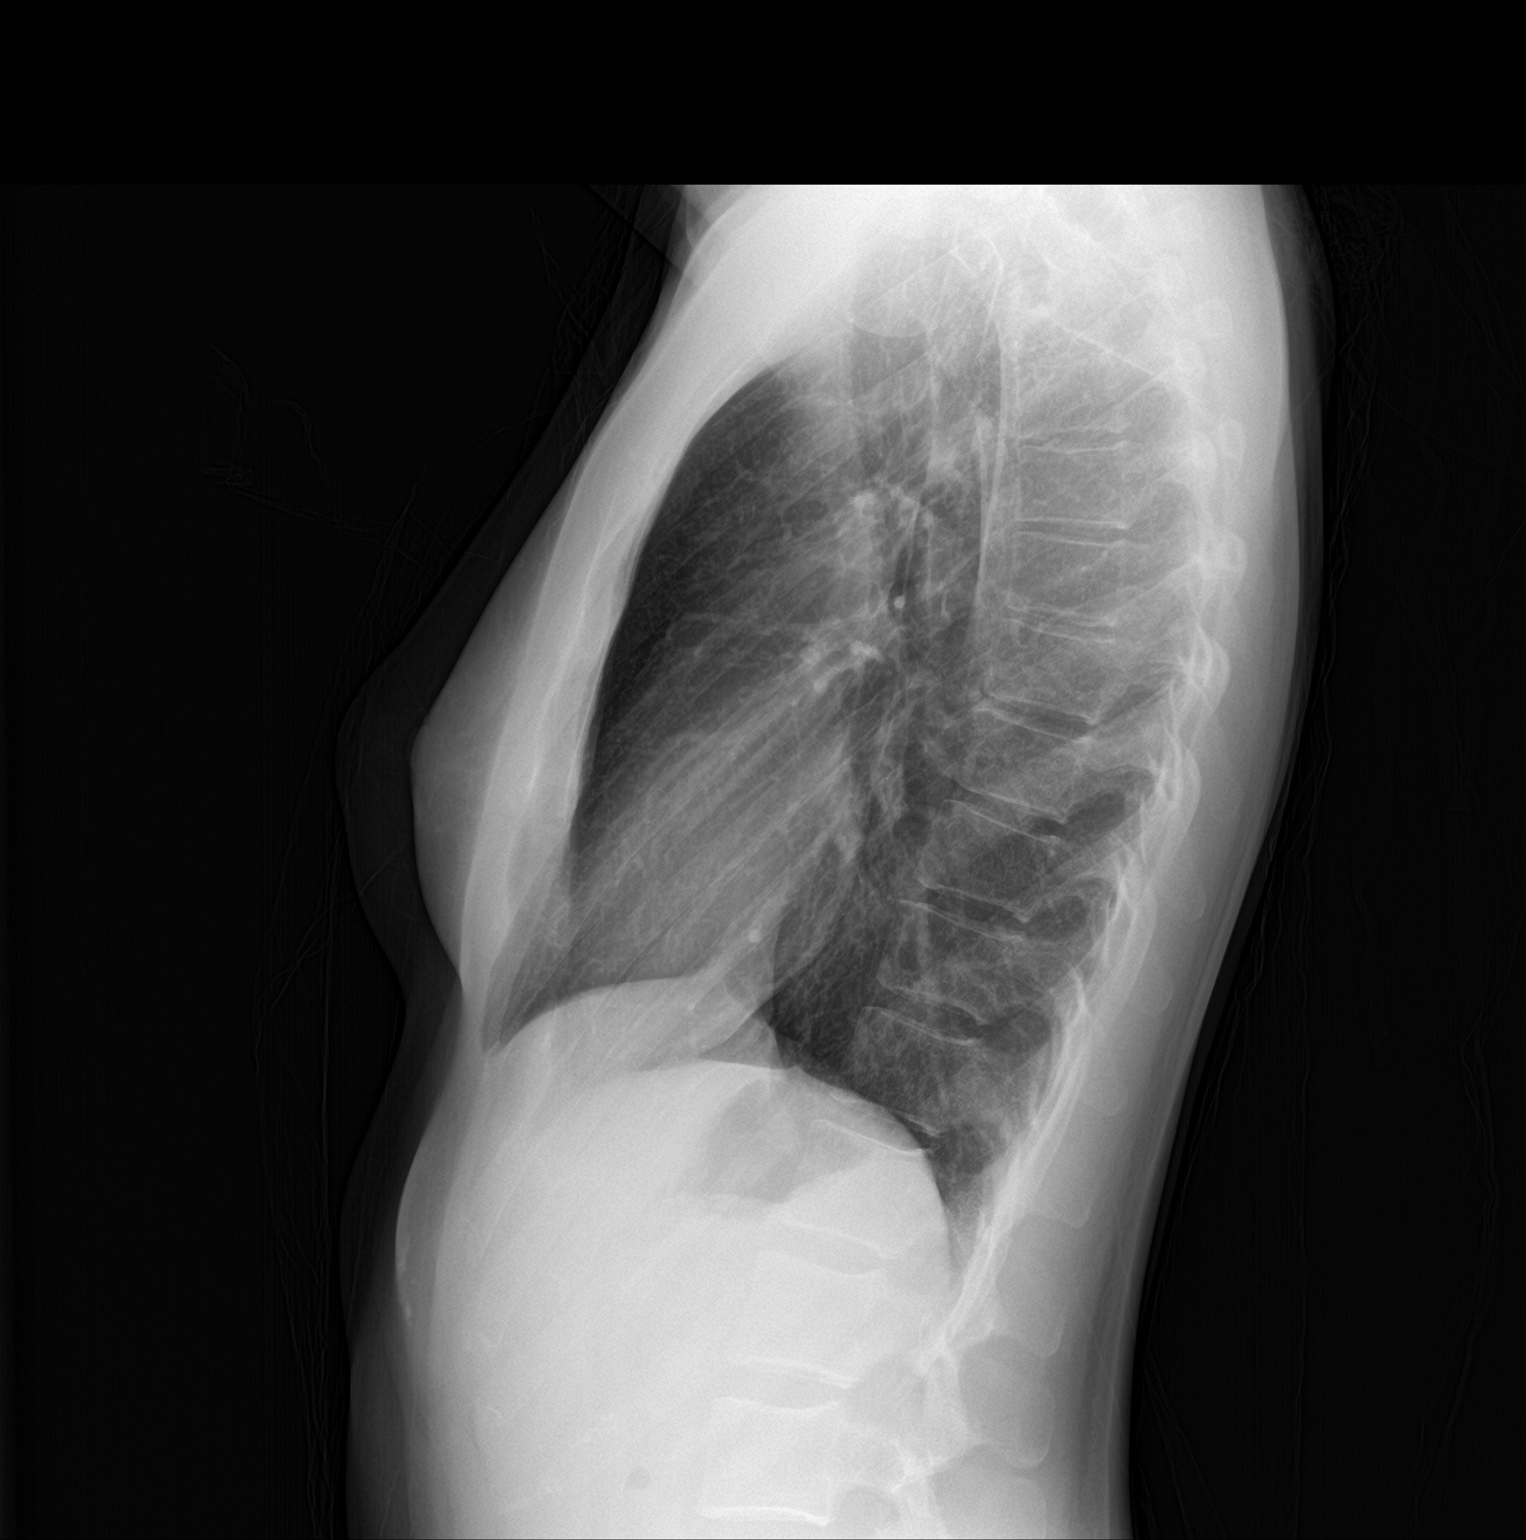

[2 of 2 positions shown; findings below may reference images not displayed]

FINDINGS: Normal heart size. Lungs clear. No pneumothorax. No pleural
effusion.
IMPRESSION: No active cardiopulmonary disease.

## 2019-08-06 MED FILL — DOXYLAMINE-PYRIDOXINE 10-10: 10-10 | 30 days supply | Qty: 120 | Fill #0

## 2019-08-23 DIAGNOSIS — O139 Gestational [pregnancy-induced] hypertension without significant proteinuria, unspecified trimester: Secondary | ICD-10-CM

## 2019-08-23 DIAGNOSIS — O1490 Unspecified pre-eclampsia, unspecified trimester: Secondary | ICD-10-CM

## 2019-08-23 HISTORY — DX: Gestational (pregnancy-induced) hypertension without significant proteinuria, unspecified trimester: O13.9

## 2019-08-23 HISTORY — DX: Unspecified pre-eclampsia, unspecified trimester: O14.90

## 2019-08-23 NOTE — L&D Delivery Note (Signed)
Delivery Note At 3:54 PM a viable female was delivered via Vaginal, Spontaneous (Presentation: Left Occiput Anterior).  APGAR: per RN, good cry and tone, ; weight  .   Placenta status: Spontaneous, Intact.  Cord: 3 vessels with the following complications: None.  Cord pH: n/a  Anesthesia: Epidural Episiotomy: None Lacerations: 2nd degree;Perineal Suture Repair: 3.0 vicryl Est. Blood Loss (mL):  350  Maternal temp to 100.1, slight uterine bogginess, cyctotec given rectally  Mom to postpartum.  Baby to Couplet care / Skin to Skin.  Lendon Colonel 03/15/2020, 4:15 PM

## 2019-09-24 ENCOUNTER — Encounter: Payer: No Typology Code available for payment source | Admitting: Family

## 2019-09-24 ENCOUNTER — Ambulatory Visit (INDEPENDENT_AMBULATORY_CARE_PROVIDER_SITE_OTHER): Payer: No Typology Code available for payment source | Admitting: Family

## 2019-09-24 ENCOUNTER — Telehealth: Payer: Self-pay | Admitting: Family

## 2019-09-24 ENCOUNTER — Other Ambulatory Visit: Payer: Self-pay

## 2019-09-24 ENCOUNTER — Encounter: Payer: Self-pay | Admitting: Family

## 2019-09-24 VITALS — BP 113/75 | HR 101 | Temp 97.2°F | Resp 16 | Ht 67.0 in | Wt 124.0 lb

## 2019-09-24 DIAGNOSIS — Z0000 Encounter for general adult medical examination without abnormal findings: Secondary | ICD-10-CM | POA: Diagnosis not present

## 2019-09-24 MED ORDER — PRENATAL DHA 200 MG PO CAPS: ORAL_CAPSULE | ORAL | 0 refills | Status: DC

## 2019-09-24 NOTE — Telephone Encounter (Signed)
Can you please call [facility] and request a copy of her last pap?

## 2019-09-24 NOTE — Progress Notes (Signed)
Subjective:    Patient ID: Kim Wheeler, female    DOB: Aug 11, 1991, 29 y.o.   MRN: [id number]  HPI  Patient presents today for complete physical. She is 13 weeks 6 days pregnant. She reports that she is tired but otherwise feeling well. She is on a prenatal and following with OB/GYN.  Immunizations: flu shot up to date, Tdap 2018 Diet: healthy Exercise: walks 2-3 times a week Pap Smear:  12/20    Review of Systems  Constitutional: Positive for fatigue. Negative for unexpected weight change.  HENT: Negative for hearing loss and rhinorrhea.   Eyes: Negative for visual disturbance.  Respiratory: Negative for cough and shortness of breath.   Cardiovascular: Negative for chest pain.  Gastrointestinal: Negative for constipation and diarrhea.  Genitourinary: Negative for difficulty urinating, dysuria and frequency.  Musculoskeletal: Negative for arthralgias and myalgias.  Skin: Negative for rash.  Neurological: Negative for headaches.  Hematological: Negative for adenopathy.  Psychiatric/Behavioral:       Denies depression/anxiety   Past Medical History:  Diagnosis Date  . Conjunctivitis      Social History   Socioeconomic History  . Marital status: Married    Spouse name: Not on file  . Number of children: Not on file  . Years of education: Not on file  . Highest education level: Not on file  Occupational History  . Not on file  Tobacco Use  . Smoking status: Never Smoker  . Smokeless tobacco: Never Used  Substance and Sexual Activity  . Alcohol use: Yes    Comment: Wine once a month   . Drug use: No  . Sexual activity: Not on file  Other Topics Concern  . 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 Difficulty of Paying Living Expenses: Not on file  Food Insecurity:    . Worried About Charity fundraiser in the Last Year: Not on file  . Ran Out of Food in the Last Year: Not on file  Transportation Needs:   . Lack of Transportation (Medical): Not on file  . Lack of Transportation (Non-Medical): Not on file  Physical Activity:   . Days of Exercise per Week: Not on file  . Minutes of Exercise per Session: Not on file  Stress:   . Feeling of Stress : Not on file  Social Connections:   . Frequency of Communication with Friends and Family: Not on file  . Frequency of Social Gatherings with Friends and Family: Not on file  . Attends Religious Services: Not on file  . Active Member of Clubs or Organizations: Not on file  . Attends Archivist Meetings: Not on file  . Marital Status: Not on file  Intimate Partner Violence:   . Fear of Current or Ex-Partner: Not on file  . Emotionally Abused: Not on file  . Physically Abused: Not on file  . Sexually Abused: Not on file    Past Surgical History:  Procedure Laterality Date  . EYE SURGERY Bilateral 1993   strabismus     Family History  Problem Relation Age of Onset  . Cancer Mother        breast cancer  . Hypertension Mother   . Gout Father   .  Dementia Maternal Grandmother   . Stroke Maternal Grandmother   . Breast cancer Maternal Grandmother     No Known Allergies  Current Outpatient Medications on File Prior to Visit  Medication Sig Dispense Refill  . Doxylamine-Pyridoxine (DICLEGIS) 10-10 MG TBEC     . albuterol (PROVENTIL HFA;VENTOLIN HFA) 108 (90 Base) MCG/ACT inhaler Inhale 2 puffs into the lungs every 6 (six) hours as needed for wheezing or shortness of breath. 1 Inhaler 0   No current facility-administered medications on file prior to visit.    BP 113/75 (BP Location: Right Arm, Patient Position: Sitting, Cuff Size: Small)   Pulse (!) 101   Temp (!) 97.2 F (36.2 C) (Temporal)   Resp 16   Ht 5\' 7"  (1.702 m)   Wt 124 lb (56.2 kg)   SpO2 99%   BMI 19.42 kg/m        Objective:   Physical Exam  Physical Exam  Constitutional: She is oriented to person, place, and time. She appears well-developed and well-nourished. No distress.  HENT:  Head: Normocephalic and atraumatic.  Right Ear: Tympanic membrane and ear canal normal.  Left Ear: Tympanic membrane and ear canal normal.  Mouth/Throat: not examined- pt wearing mask Eyes: Pupils are equal, round, and reactive to light. No scleral icterus.  Neck: Normal range of motion. No thyromegaly present.  Cardiovascular: Normal rate and regular rhythm.   No murmur heard. Pulmonary/Chest: Effort normal and breath sounds normal. No respiratory distress. He has no wheezes. She has no rales. She exhibits no tenderness.  Abdominal: (gravid) Bowel sounds are normal. She exhibits no distension and no mass. There is no tenderness. There is no rebound and no guarding.  Musculoskeletal: She exhibits no edema.  Lymphadenopathy:    She has no cervical adenopathy.  Neurological: She is alert and oriented to person, place, and time. She has normal patellar reflexes. She exhibits normal muscle tone. Coordination normal.  Skin: Skin is warm and dry.  Psychiatric: She has a normal mood and affect. Her behavior is normal. Judgment and thought content normal.  Breast/pelvic: deferred          Assessment & Plan:   Preventative care- encouraged pt to continue healthy diet and regular exercise. Immunizations reviewed and up to date. She will return for routine lab work.       Assessment & Plan:

## 2019-09-25 NOTE — Telephone Encounter (Signed)
Records release faxed over to [facility]

## 2019-09-30 ENCOUNTER — Other Ambulatory Visit (INDEPENDENT_AMBULATORY_CARE_PROVIDER_SITE_OTHER): Payer: No Typology Code available for payment source

## 2019-09-30 ENCOUNTER — Other Ambulatory Visit: Payer: Self-pay

## 2019-09-30 DIAGNOSIS — Z0000 Encounter for general adult medical examination without abnormal findings: Secondary | ICD-10-CM | POA: Diagnosis not present

## 2019-10-01 LAB — LIPID PANEL
Cholesterol: 136 mg/dL (ref 0–200)
HDL: 54 mg/dL (ref >39.00)
LDL Cholesterol: 56 mg/dL (ref 0–99)
NonHDL: 81.93
Total CHOL/HDL Ratio: 3
Triglycerides: 132 mg/dL (ref 0.0–149.0)
VLDL: 26.4 mg/dL (ref 0.0–40.0)

## 2019-10-01 LAB — COMPREHENSIVE METABOLIC PANEL
ALT: 11 U/L (ref 0–35)
AST: 13 U/L (ref 0–37)
Albumin: 4.1 g/dL (ref 3.5–5.2)
Alkaline Phosphatase: 40 U/L (ref 39–117)
BUN: 10 mg/dL (ref 6–23)
CO2: 25 mEq/L (ref 19–32)
Calcium: 9.6 mg/dL (ref 8.4–10.5)
Chloride: 104 mEq/L (ref 96–112)
Creatinine, Ser: 0.58 mg/dL (ref 0.40–1.20)
GFR: 122.25 mL/min (ref >60.00)
Glucose, Bld: 83 mg/dL (ref 70–99)
Potassium: 3.4 mEq/L — ABNORMAL LOW (ref 3.5–5.1)
Sodium: 135 mEq/L (ref 135–145)
Total Bilirubin: 0.5 mg/dL (ref 0.2–1.2)
Total Protein: 6.7 g/dL (ref 6.0–8.3)

## 2019-10-01 LAB — CBC WITH DIFFERENTIAL/PLATELET
Basophils Absolute: 0.1 10*3/uL (ref 0.0–0.1)
Basophils Relative: 0.5 % (ref 0.0–3.0)
Eosinophils Absolute: 0.1 10*3/uL (ref 0.0–0.7)
Eosinophils Relative: 0.7 % (ref 0.0–5.0)
HCT: 31.1 % — ABNORMAL LOW (ref 36.0–46.0)
Hemoglobin: 11 g/dL — ABNORMAL LOW (ref 12.0–15.0)
Lymphocytes Relative: 15.5 % (ref 12.0–46.0)
Lymphs Abs: 1.8 10*3/uL (ref 0.7–4.0)
MCHC: 35.3 g/dL (ref 30.0–36.0)
MCV: 93 fl (ref 78.0–100.0)
Monocytes Absolute: 0.6 10*3/uL (ref 0.1–1.0)
Monocytes Relative: 5.1 % (ref 3.0–12.0)
Neutro Abs: 9.3 10*3/uL — ABNORMAL HIGH (ref 1.4–7.7)
Neutrophils Relative %: 78.2 % — ABNORMAL HIGH (ref 43.0–77.0)
Platelets: 286 10*3/uL (ref 150.0–400.0)
RBC: 3.35 Mil/uL — ABNORMAL LOW (ref 3.87–5.11)
RDW: 12.8 % (ref 11.5–15.5)
WBC: 11.9 10*3/uL — ABNORMAL HIGH (ref 4.0–10.5)

## 2019-10-01 LAB — TSH: TSH: 2.09 u[IU]/mL (ref 0.35–4.50)

## 2019-10-02 ENCOUNTER — Encounter: Payer: Self-pay | Admitting: Family

## 2020-01-22 MED FILL — FERRAPLUS 90 TABLET: 90-1 | 90 days supply | Qty: 90 | Fill #0

## 2020-02-20 LAB — OB RESULTS CONSOLE GBS: GBS: NEGATIVE

## 2020-03-13 ENCOUNTER — Other Ambulatory Visit: Payer: Self-pay | Admitting: Obstetrics

## 2020-03-14 NOTE — H&P (Signed)
Historical entry- planned admission for IOL, pt showed up several hours early in active labor with SROM  Kim Wheeler is a 29 y.o.  G1 at 38'4 prresenting for IOL due to gest htn. Pt notes rare contractions. Good fetal movement, No vaginal bleeding, not leaking fluid. No HA, no scotomata, no RUQ pain.   [facility] at [facility] since 6 wks - Dated by LMP c/w 6wk u/s - Anemia, on iron - GERD, Pepcid - gest htn, elevated bps, intermittently over 3rd trimester, bp at 38 wks 140s/80s, persistent. - fetal growth- 36 wks, 6'1, 43%   Prenatal Transfer Tool  Maternal Diabetes: No Genetic Screening: Normal Maternal Ultrasounds/Referrals: Normal Fetal Ultrasounds or other Referrals:  None Maternal Substance Abuse:  No Significant Maternal Medications:  None Significant Maternal Lab Results: Group B Strep negative     OB History   No obstetric history on file.    Past Medical History:  Diagnosis Date   Conjunctivitis    Past Surgical History:  Procedure Laterality Date   EYE SURGERY Bilateral 1993   strabismus    Family History: family history includes Breast cancer in her maternal grandmother; Cancer in her mother; Dementia in her maternal grandmother; Gout in her father; Hypertension in her mother; Stroke in her maternal grandmother. Social History:  reports that she has never smoked. She has never used smokeless tobacco. She reports previous alcohol use. She reports that she does not use drugs.  Prenatal labs: ABO, Rh:  O+ Antibody:  negative Rubella:  immune RPR:   NR HBsAg:   neg HIV:   neg GBS:   neg 1 hr Glucola 114  Genetic screening normal NT, nl AFP Anatomy US normal   Assessment/Plan: 29 y.o. G1 at 38'4 for IOL  - Gest htn, no evidence PEC, move towards delivery - GBS neg - IOL, plan cytotec x 1-2, possible cervical foley   Lendon Colonel 03/14/2020, 10:41 PM

## 2020-03-15 ENCOUNTER — Inpatient Hospital Stay (HOSPITAL_COMMUNITY)
Admission: AD | Admit: 2020-03-15 | Payer: No Typology Code available for payment source | Source: Home / Self Care | Admitting: Obstetrics

## 2020-03-15 ENCOUNTER — Other Ambulatory Visit: Payer: Self-pay

## 2020-03-15 ENCOUNTER — Inpatient Hospital Stay (HOSPITAL_COMMUNITY)
Admission: AD | Admit: 2020-03-15 | Discharge: 2020-03-17 | DRG: 806 | Disposition: A | Discharge status: Home / Self Care | Payer: No Typology Code available for payment source | Attending: Obstetrics | Admitting: Obstetrics

## 2020-03-15 ENCOUNTER — Inpatient Hospital Stay (HOSPITAL_COMMUNITY): Payer: No Typology Code available for payment source | Admitting: Anesthesiology

## 2020-03-15 ENCOUNTER — Inpatient Hospital Stay (HOSPITAL_COMMUNITY): Payer: No Typology Code available for payment source

## 2020-03-15 ENCOUNTER — Encounter (HOSPITAL_COMMUNITY): Payer: Self-pay | Admitting: Obstetrics and Gynecology

## 2020-03-15 DIAGNOSIS — Z3A38 38 weeks gestation of pregnancy: Secondary | ICD-10-CM | POA: Diagnosis not present

## 2020-03-15 DIAGNOSIS — O26893 Other specified pregnancy related conditions, third trimester: Secondary | ICD-10-CM | POA: Diagnosis present

## 2020-03-15 DIAGNOSIS — O9902 Anemia complicating childbirth: Secondary | ICD-10-CM | POA: Diagnosis present

## 2020-03-15 DIAGNOSIS — D649 Anemia, unspecified: Secondary | ICD-10-CM | POA: Diagnosis present

## 2020-03-15 DIAGNOSIS — Z20822 Contact with and (suspected) exposure to covid-19: Secondary | ICD-10-CM | POA: Diagnosis present

## 2020-03-15 DIAGNOSIS — O134 Gestational [pregnancy-induced] hypertension without significant proteinuria, complicating childbirth: Principal | ICD-10-CM | POA: Diagnosis present

## 2020-03-15 LAB — CBC
HCT: 32.5 % — ABNORMAL LOW (ref 36.0–46.0)
Hemoglobin: 11 g/dL — ABNORMAL LOW (ref 12.0–15.0)
MCH: 33.3 pg (ref 26.0–34.0)
MCHC: 33.8 g/dL (ref 30.0–36.0)
MCV: 98.5 fL (ref 80.0–100.0)
Platelets: 222 10*3/uL (ref 150–400)
RBC: 3.3 MIL/uL — ABNORMAL LOW (ref 3.87–5.11)
RDW: 13.1 % (ref 11.5–15.5)
WBC: 13 10*3/uL — ABNORMAL HIGH (ref 4.0–10.5)
nRBC: 0 % (ref 0.0–0.2)

## 2020-03-15 LAB — TYPE AND SCREEN
ABO/RH(D): O POS
Antibody Screen: NEGATIVE

## 2020-03-15 LAB — COMPREHENSIVE METABOLIC PANEL
ALT: 18 U/L (ref 0–44)
AST: 22 U/L (ref 15–41)
Albumin: 2.7 g/dL — ABNORMAL LOW (ref 3.5–5.0)
Alkaline Phosphatase: 116 U/L (ref 38–126)
Anion gap: 9 (ref 5–15)
BUN: <5 mg/dL — ABNORMAL LOW (ref 6–20)
CO2: 22 mmol/L (ref 22–32)
Calcium: 8.9 mg/dL (ref 8.9–10.3)
Chloride: 108 mmol/L (ref 98–111)
Creatinine, Ser: 0.6 mg/dL (ref 0.44–1.00)
GFR calc Af Amer: >60 mL/min (ref >60)
GFR calc non Af Amer: >60 mL/min (ref >60)
Glucose, Bld: 99 mg/dL (ref 70–99)
Potassium: 3.4 mmol/L — ABNORMAL LOW (ref 3.5–5.1)
Sodium: 139 mmol/L (ref 135–145)
Total Bilirubin: 0.9 mg/dL (ref 0.3–1.2)
Total Protein: 6.1 g/dL — ABNORMAL LOW (ref 6.5–8.1)

## 2020-03-15 LAB — SARS CORONAVIRUS 2 BY RT PCR (HOSPITAL ORDER, PERFORMED IN ~~LOC~~ HOSPITAL LAB): SARS Coronavirus 2: NEGATIVE

## 2020-03-15 LAB — URIC ACID: Uric Acid, Serum: 4.4 mg/dL (ref 2.5–7.1)

## 2020-03-15 LAB — PROTEIN / CREATININE RATIO, URINE
Creatinine, Urine: 27.23 mg/dL
Total Protein, Urine: <6 mg/dL

## 2020-03-15 LAB — POCT FERN TEST: POCT Fern Test: POSITIVE

## 2020-03-15 LAB — ABO/RH: ABO/RH(D): O POS

## 2020-03-15 LAB — RPR: RPR Ser Ql: NONREACTIVE

## 2020-03-15 MED ORDER — DIBUCAINE (PERIANAL) 1 % EX OINT: 1.0000 "application " | TOPICAL_OINTMENT | CUTANEOUS | Status: DC | PRN

## 2020-03-15 MED ORDER — LACTATED RINGERS IV SOLN: 500.0000 mL | Freq: Once | INTRAVENOUS | Status: DC

## 2020-03-15 MED ORDER — ONDANSETRON HCL 4 MG/2ML IJ SOLN: 4.0000 mg | Freq: Four times a day (QID) | INTRAMUSCULAR | Status: DC | PRN

## 2020-03-15 MED ORDER — OXYCODONE HCL 5 MG PO TABS: 10.0000 mg | ORAL_TABLET | ORAL | Status: DC | PRN

## 2020-03-15 MED ORDER — PHENYLEPHRINE 40 MCG/ML (10ML) SYRINGE FOR IV PUSH (FOR BLOOD PRESSURE SUPPORT): 80.0000 ug | PREFILLED_SYRINGE | INTRAVENOUS | Status: DC | PRN

## 2020-03-15 MED ORDER — DIPHENHYDRAMINE HCL 50 MG/ML IJ SOLN: 12.5000 mg | INTRAMUSCULAR | Status: DC | PRN

## 2020-03-15 MED ORDER — OXYTOCIN-SODIUM CHLORIDE 30-0.9 UT/500ML-% IV SOLN
2.5000 [IU]/h | INTRAVENOUS | Status: DC
  Filled 2020-03-15: qty 500

## 2020-03-15 MED ORDER — ZOLPIDEM TARTRATE 5 MG PO TABS: 5.0000 mg | ORAL_TABLET | Freq: Every evening | ORAL | Status: DC | PRN

## 2020-03-15 MED ORDER — WITCH HAZEL-GLYCERIN EX PADS: 1.0000 "application " | MEDICATED_PAD | CUTANEOUS | Status: DC | PRN

## 2020-03-15 MED ORDER — OXYTOCIN BOLUS FROM INFUSION
333.0000 mL | Freq: Once | INTRAVENOUS | Status: AC
  Administered 2020-03-15: 333 mL via INTRAVENOUS

## 2020-03-15 MED ORDER — ACETAMINOPHEN 500 MG PO TABS
1000.0000 mg | ORAL_TABLET | Freq: Once | ORAL | Status: AC
  Administered 2020-03-15: 1000 mg via ORAL
  Filled 2020-03-15: qty 2

## 2020-03-15 MED ORDER — SENNOSIDES-DOCUSATE SODIUM 8.6-50 MG PO TABS
2.0000 | ORAL_TABLET | ORAL | Status: DC
  Administered 2020-03-16: 2 via ORAL
  Filled 2020-03-15: qty 2

## 2020-03-15 MED ORDER — OXYCODONE-ACETAMINOPHEN 5-325 MG PO TABS: 2.0000 | ORAL_TABLET | ORAL | Status: DC | PRN

## 2020-03-15 MED ORDER — ONDANSETRON HCL 4 MG/2ML IJ SOLN: 4.0000 mg | INTRAMUSCULAR | Status: DC | PRN

## 2020-03-15 MED ORDER — MISOPROSTOL 200 MCG PO TABS: 800.0000 ug | ORAL_TABLET | Freq: Once | ORAL | Status: AC

## 2020-03-15 MED ORDER — DIPHENHYDRAMINE HCL 25 MG PO CAPS: 25.0000 mg | ORAL_CAPSULE | Freq: Four times a day (QID) | ORAL | Status: DC | PRN

## 2020-03-15 MED ORDER — IBUPROFEN 600 MG PO TABS
600.0000 mg | ORAL_TABLET | Freq: Four times a day (QID) | ORAL | Status: DC
  Administered 2020-03-15 – 2020-03-17 (×7): 600 mg via ORAL
  Filled 2020-03-15 (×7): qty 1

## 2020-03-15 MED ORDER — COCONUT OIL OIL
1.0000 "application " | TOPICAL_OIL | Status: DC | PRN
  Administered 2020-03-17: 1 via TOPICAL

## 2020-03-15 MED ORDER — TETANUS-DIPHTH-ACELL PERTUSSIS 5-2.5-18.5 LF-MCG/0.5 IM SUSP: 0.5000 mL | Freq: Once | INTRAMUSCULAR | Status: DC

## 2020-03-15 MED ORDER — OXYCODONE HCL 5 MG PO TABS: 5.0000 mg | ORAL_TABLET | ORAL | Status: DC | PRN

## 2020-03-15 MED ORDER — ONDANSETRON HCL 4 MG PO TABS: 4.0000 mg | ORAL_TABLET | ORAL | Status: DC | PRN

## 2020-03-15 MED ORDER — LACTATED RINGERS IV SOLN: 500.0000 mL | INTRAVENOUS | Status: DC | PRN

## 2020-03-15 MED ORDER — MISOPROSTOL 200 MCG PO TABS
ORAL_TABLET | ORAL | Status: AC
  Administered 2020-03-15: 800 ug via RECTAL
  Filled 2020-03-15: qty 4

## 2020-03-15 MED ORDER — OXYCODONE-ACETAMINOPHEN 5-325 MG PO TABS: 1.0000 | ORAL_TABLET | ORAL | Status: DC | PRN

## 2020-03-15 MED ORDER — SOD CITRATE-CITRIC ACID 500-334 MG/5ML PO SOLN: 30.0000 mL | ORAL | Status: DC | PRN

## 2020-03-15 MED ORDER — EPHEDRINE 5 MG/ML INJ: 10.0000 mg | INTRAVENOUS | Status: DC | PRN

## 2020-03-15 MED ORDER — FAMOTIDINE 20 MG PO TABS
20.0000 mg | ORAL_TABLET | Freq: Two times a day (BID) | ORAL | Status: DC
  Administered 2020-03-15 – 2020-03-16 (×2): 20 mg via ORAL
  Filled 2020-03-15 (×4): qty 1

## 2020-03-15 MED ORDER — ACETAMINOPHEN 325 MG PO TABS: 650.0000 mg | ORAL_TABLET | ORAL | Status: DC | PRN

## 2020-03-15 MED ORDER — PHENYLEPHRINE 40 MCG/ML (10ML) SYRINGE FOR IV PUSH (FOR BLOOD PRESSURE SUPPORT)
80.0000 ug | PREFILLED_SYRINGE | INTRAVENOUS | Status: DC | PRN
  Filled 2020-03-15: qty 10

## 2020-03-15 MED ORDER — FLEET ENEMA 7-19 GM/118ML RE ENEM: 1.0000 | ENEMA | RECTAL | Status: DC | PRN

## 2020-03-15 MED ORDER — SODIUM CHLORIDE (PF) 0.9 % IJ SOLN
INTRAMUSCULAR | Status: DC | PRN
  Administered 2020-03-15: 12 mL/h via EPIDURAL

## 2020-03-15 MED ORDER — LIDOCAINE HCL (PF) 1 % IJ SOLN: 30.0000 mL | INTRAMUSCULAR | Status: DC | PRN

## 2020-03-15 MED ORDER — PRENATAL MULTIVITAMIN CH
1.0000 | ORAL_TABLET | Freq: Every day | ORAL | Status: DC
  Administered 2020-03-16: 1 via ORAL
  Filled 2020-03-15: qty 1

## 2020-03-15 MED ORDER — FENTANYL-BUPIVACAINE-NACL 0.5-0.125-0.9 MG/250ML-% EP SOLN
12.0000 mL/h | EPIDURAL | Status: DC | PRN
  Filled 2020-03-15: qty 250

## 2020-03-15 MED ORDER — BENZOCAINE-MENTHOL 20-0.5 % EX AERO
1.0000 "application " | INHALATION_SPRAY | CUTANEOUS | Status: DC | PRN
  Administered 2020-03-16 – 2020-03-17 (×2): 1 via TOPICAL
  Filled 2020-03-15 (×2): qty 56

## 2020-03-15 MED ORDER — LIDOCAINE HCL (PF) 1 % IJ SOLN
INTRAMUSCULAR | Status: DC | PRN
  Administered 2020-03-15 (×2): 4 mL via EPIDURAL

## 2020-03-15 MED ORDER — LACTATED RINGERS IV SOLN: INTRAVENOUS | Status: DC

## 2020-03-15 MED ORDER — SIMETHICONE 80 MG PO CHEW: 80.0000 mg | CHEWABLE_TABLET | ORAL | Status: DC | PRN

## 2020-03-15 NOTE — H&P (Signed)
Kim Wheeler is a 29 y.o. G1P0 at [redacted]w[redacted]d gestation presents for complaint of Loss of fluid  (clear) and then started having contractions.  No vb, +FM.  Denies h/a, vision changes, ruq pain.  Antepartum course: gestational hypertension  [facility] at [facility] since 10 wks.  See complete pre-natal records  History OB History    Gravida  1   Para      Term      Preterm      AB      Living        SAB      TAB      Ectopic      Multiple      Live Births             Past Medical History:  Diagnosis Date  . Conjunctivitis    Past Surgical History:  Procedure Laterality Date  . EYE SURGERY Bilateral 1993   strabismus    Family History: family history includes Breast cancer in her maternal grandmother; Cancer in her mother; Dementia in her maternal grandmother; Gout in her father; Hypertension in her mother; Stroke in her maternal grandmother. Social History:  reports that she has never smoked. She has never used smokeless tobacco. She reports previous alcohol use. She reports that she does not use drugs.  ROS: See above otherwise negative  Prenatal labs:  ABO, Rh: --/--/O POS Performed at [facility], [address] [place], [place] [address]  [phone]) Antibody: NEG (07/25 0459) Rubella:   immune RPR:   nr HBsAg:   nr HIV:  nr GBS: Negative/-- (07/01 0000)  1 hr Glucola: Normal Genetic screening: Normal Anatomy US: Normal  Physical Exam:   Dilation: 5 Effacement (%): 80 Station: -1 Exam by:: k. jones RN  Blood pressure 114/65, pulse 72, temperature 97.8 F (36.6 C), temperature source Oral, resp. rate 22, height 5\' 7"  (1.702 m), weight 75.9 kg, SpO2 99 %. A&O x 3 HEENT: Normal Lungs: CTAB CV: RRR Abdominal: Soft, Non-tender, Gravid and Estimated fetal weight: 6 1/2 lbs lbs  Lower Extremities: tr edema, nt bilat  Pelvic Exam:      Deferred, exam just done by nursing 5/80/-1 Labs:  CBC:  Lab Results  Component Value Date    WBC 13.0 (H) 03/15/2020   RBC 3.30 (L) 03/15/2020   HGB 11.0 (L) 03/15/2020   HCT 32.5 (L) 03/15/2020   MCV 98.5 03/15/2020   MCH 33.3 03/15/2020   MCHC 33.8 03/15/2020   RDW 13.1 03/15/2020   PLT 222 03/15/2020   CMP:  Lab Results  Component Value Date   NA 139 03/15/2020   K 3.4 (L) 03/15/2020   CL 108 03/15/2020   CO2 22 03/15/2020   GLUCOSE 99 03/15/2020   BUN <5 (L) 03/15/2020   CREATININE 0.60 03/15/2020   CALCIUM 8.9 03/15/2020   PROT 6.1 (L) 03/15/2020   AST 22 03/15/2020   ALT 18 03/15/2020   ALBUMIN 2.7 (L) 03/15/2020   ALKPHOS 116 03/15/2020   BILITOT 0.9 03/15/2020   GFRNONAA >60 03/15/2020   GFRAA >60 03/15/2020   ANIONGAP 9 03/15/2020   Urine: Lab Results  Component Value Date   COLORURINE YELLOW 09/18/2018   APPEARANCEUR CLEAR 09/18/2018   LABSPEC 1.010 09/18/2018   PHURINE 7.0 09/18/2018   GLUCOSEU NEGATIVE 09/18/2018   HGBUR NEGATIVE 09/18/2018   BILIRUBINUR NEGATIVE 09/18/2018   KETONESUR NEGATIVE 09/18/2018   PROTEINUR Neg 09/01/2017   NITRITE NEGATIVE 09/18/2018  LEUKOCYTESUR NEGATIVE 09/18/2018   FHT: 140s, nml variability, +accels, no decels TOCO: q2 min  Prenatal Transfer Tool  Maternal Diabetes: No Genetic Screening: Normal Maternal Ultrasounds/Referrals: Normal Fetal Ultrasounds or other Referrals:  None Maternal Substance Abuse:  No Significant Maternal Medications:  None Significant Maternal Lab Results: Group B Strep negative    Assessment/Plan:  29 y.o. G1P0 at [redacted]w[redacted]d gestation   1. Srom, active labor - admit for delivery, plan svd; s/p epidural 2. GHTN - asymptomatic, labs wnl, pr/cr ratio pending 3. efw 6lbs 4. Rh pos 5. RI 6. Fetal status reassuring  7. Chronic anemia  Vick Frees 03/15/2020, 10:43 AM

## 2020-03-15 NOTE — Anesthesia Preprocedure Evaluation (Signed)
Anesthesia Evaluation  Patient identified by MRN, date of birth, ID band Patient awake    Reviewed: Allergy & Precautions, Patient's Chart, lab work & pertinent test results  History of Anesthesia Complications Negative for: history of anesthetic complications  Airway Mallampati: II  TM Distance: >3 FB Neck ROM: Full    Dental no notable dental hx.    Pulmonary neg pulmonary ROS,    Pulmonary exam normal        Cardiovascular negative cardio ROS Normal cardiovascular exam     Neuro/Psych negative neurological ROS  negative psych ROS   GI/Hepatic negative GI ROS, Neg liver ROS,   Endo/Other  negative endocrine ROS  Renal/GU negative Renal ROS  negative genitourinary   Musculoskeletal negative musculoskeletal ROS (+)   Abdominal   Peds  Hematology negative hematology ROS (+)   Anesthesia Other Findings Day of surgery medications reviewed with patient.  Reproductive/Obstetrics (+) Pregnancy                             Anesthesia Physical Anesthesia Plan  ASA: II  Anesthesia Plan: Epidural   Post-op Pain Management:    Induction:   PONV Risk Score and Plan: Treatment may vary due to age or medical condition  Airway Management Planned: Natural Airway  Additional Equipment:   Intra-op Plan:   Post-operative Plan:   Informed Consent: I have reviewed the patients History and Physical, chart, labs and discussed the procedure including the risks, benefits and alternatives for the proposed anesthesia with the patient or authorized representative who has indicated his/her understanding and acceptance.       Plan Discussed with:   Anesthesia Plan Comments:         Anesthesia Quick Evaluation  

## 2020-03-15 NOTE — Progress Notes (Signed)
S: Doing well, no complaints, pain well controlled with epidural  O: BP 126/67   Pulse 73   Temp 98.9 F (37.2 C) (Oral)   Resp 22   Ht 5\' 7"  (1.702 m)   Wt 75.9 kg   SpO2 99%   BMI 26.20 kg/m    FHT:  FHR: 150s\ bpm, variability: moderate,  accelerations:  Present,  decelerations:  Absent UC:   regular, every 3 minutes SVE:   Dilation: 10 Effacement (%): 100 Station: 0, -1 Exam by:: k. jones   A / P:  29 y.o.  OB History  Gravida Para Term Preterm AB Living  1 0 0 0 0 0  SAB TAB Ectopic Multiple Live Births  0 0 0 0 0   at [redacted]w[redacted]d Spontaneous labor, progressing normally  Gest htn, bps mild to normal. Labs neg.   Fetal Wellbeing:  Category I Pain Control:  Epidural  Anticipated MOD:  NSVD  [redacted]w[redacted]d 03/15/2020, 2:50 PM

## 2020-03-15 NOTE — MAU Note (Signed)
..  Kim Wheeler is a 29 y.o. at [redacted]w[redacted]d here in MAU reporting: A gush of fluid at 0220 and a small trickle since then. Pt reports CTX every 6 minutes rating them 1/10 in pain. No vaginal bleeding. +FM

## 2020-03-15 NOTE — Anesthesia Procedure Notes (Signed)
Epidural Patient location during procedure: OB Start time: 03/15/2020 9:41 AM End time: 03/15/2020 9:44 AM  Staffing Anesthesiologist: Kaylyn Layer, MD Performed: anesthesiologist   Preanesthetic Checklist Completed: patient identified, IV checked, risks and benefits discussed, monitors and equipment checked, pre-op evaluation and timeout performed  Epidural Patient position: sitting Prep: DuraPrep and site prepped and draped Patient monitoring: continuous pulse ox, blood pressure and heart rate Approach: midline Location: L3-L4 Injection technique: LOR air  Needle:  Needle type: Tuohy  Needle gauge: 17 G Needle length: 9 cm Needle insertion depth: 5 cm Catheter type: closed end flexible Catheter size: 19 Gauge Catheter at skin depth: 10 cm Test dose: negative and Other (1% lidocaine)  Assessment Events: blood not aspirated, injection not painful, no injection resistance, no paresthesia and negative IV test  Additional Notes Patient identified. Risks, benefits, and alternatives discussed with patient including but not limited to bleeding, infection, nerve damage, paralysis, failed block, incomplete pain control, headache, blood pressure changes, nausea, vomiting, reactions to medication, itching, and postpartum back pain. Confirmed with bedside nurse the patient's most recent platelet count. Confirmed with patient that they are not currently taking any anticoagulation, have any bleeding history, or any family history of bleeding disorders. Patient expressed understanding and wished to proceed. All questions were answered. Sterile technique was used throughout the entire procedure. Please see nursing notes for vital signs.   Crisp LOR after one needle redirection. Test dose was given through epidural catheter and negative prior to continuing to dose epidural or start infusion. Warning signs of high block given to the patient including shortness of breath, tingling/numbness in  hands, complete motor block, or any concerning symptoms with instructions to call for help. Patient was given instructions on fall risk and not to get out of bed. All questions and concerns addressed with instructions to call with any issues or inadequate analgesia.  Reason for block:procedure for pain

## 2020-03-16 NOTE — Lactation Note (Addendum)
This note was copied from a baby's chart. Lactation Consultation Note  Patient Name: Kim Wheeler [id number] Date: 03/16/2020  baby Kim [name] now 61 hours old.  Mom is a g1 p1.  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  Mom has not decided what DEBP she would like.  Took mom the list for pumps offered through her insurance. Mom reports she has her insurance card with her.  Mom reports she would like to wait until tomorrow to get her pump. Reviewed normal newborn behavior.  Urged to feed on cue and at least 8-12 times day.  Reviewed baby's second night and what to expect.  Infant has had minimal weight loss and adequate voids and stools thus far. LC did not give [facility] Breastfeeding Consultation Resource list at this time. Mom reports her nipples are a little sore and looks like blood blisters on tips.  Reviewed how to get a deep latch and urged mom to hand express and rub expressed mothers milk on nipples and air dry. LC assist mom with hand expression.  Glistenings on nipple tip.  Left mom still working on hand expressing.  Praised breastfeeding.  Urged parents to call lactation as needed.    Maternal Data    Feeding Feeding Type: Breast Milk  LATCH Score Latch: Repeated attempts needed to sustain latch, nipple held in mouth throughout feeding, stimulation needed to elicit sucking reflex.  Audible Swallowing: Spontaneous and intermittent  Type of Nipple: Everted at rest and after stimulation  Comfort (Breast/Nipple): Soft / non-tender  Hold (Positioning): Assistance needed to correctly position infant at breast and maintain latch.  LATCH Score: 8  Interventions Interventions: Breast feeding basics reviewed;Assisted with latch;Adjust position;Support pillows  Lactation Tools Discussed/Used     Consult Status      Neomia Dear 03/16/2020, 8:36 PM

## 2020-03-16 NOTE — Progress Notes (Signed)
PPD #1, SVD, 2nd degree perineal repair, baby girl "[name]"  S:  Reports feeling well, notes some fatigue and soreness  Denies HA, visual changes, RUQ/epigastric pain              Tolerating po/ No nausea or vomiting / Denies dizziness or SOB             Bleeding is moderate             Pain controlled withMotrin             Up ad lib / ambulatory / voiding QS without difficulty  Newborn breast feeding - going okay, desires to see lactation  O:               VS: BP (!) 127/87 (BP Location: Left Arm)   Pulse 90   Temp 98.7 F (37.1 C) (Oral)   Resp 18   Ht 5\' 7"  (1.702 m)   Wt 75.9 kg   SpO2 100%   BMI 26.20 kg/m  Patient Vitals for the past 24 hrs:  BP Temp Temp src Pulse Resp SpO2  03/16/20 1000 (!) 127/87 98.7 F (37.1 C) Oral 90 18 100 %  03/16/20 0538 (!) 131/81 98 F (36.7 C) Oral 88 17 99 %  03/15/20 2350 (!) 132/70 98.2 F (36.8 C) Oral 94 18 98 %  03/15/20 1940 (!) 124/87 98.8 F (37.1 C) Oral 88 17 99 %  03/15/20 1832 (!) 130/90 99.3 F (37.4 C) Oral 85 18 --  03/15/20 1745 (!) 138/83 -- -- 100 16 --  03/15/20 1715 (!) 138/78 (!) 100.9 F (38.3 C) Axillary 77 14 --  03/15/20 1645 (!) 141/86 -- -- 80 16 --  03/15/20 1630 (!) 144/72 (!) 100.9 F (38.3 C) Axillary 78 18 --  03/15/20 1615 (!) 139/80 -- -- 80 -- --  03/15/20 1605 (!) 140/80 100.1 F (37.8 C) Axillary 94 -- --  03/15/20 1600 (!) 132/72 -- -- 96 -- --  03/15/20 1530 124/76 -- -- 75 -- --  03/15/20 1510 -- 99 F (37.2 C) Axillary -- -- --  03/15/20 1500 128/76 -- -- (!) 106 -- --  03/15/20 1430 (!) 121/100 -- -- (!) 120 -- --  03/15/20 1400 125/67 -- -- 82 -- --  03/15/20 1330 126/67 -- -- 73 -- --  03/15/20 1303 (!) 123/64 -- -- 62 -- --  03/15/20 1300 -- 98.9 F (37.2 C) Oral -- -- --  03/15/20 1230 (!) 126/60 -- -- 67 -- --  03/15/20 1200 119/65 -- -- 69 -- --  03/15/20 1130 124/65 -- -- 72 -- --    LABS:              Recent Labs    03/15/20 0459  WBC 13.0*  HGB 11.0*  PLT 222                Blood type: --/--/O POS Performed at [facility], [address], [place], [place]  [phone])  Rubella:                       I&O: Intake/Output      07/25 0701 - 07/26 0700 07/26 0701 - 07/27 0700   Urine (mL/kg/hr) 1150 (0.6)    Blood 350    Total Output 1500    Net -1500  Physical Exam:             Alert and oriented X3  Lungs: Clear and unlabored  Heart: regular rate and rhythm / no murmurs  Abdomen: soft, non-tender, non-distended              Fundus: firm, non-tender, U-E (bladder full)  Perineum: well approximated 2nd degree, mild edema ice pack in place  Lochia: moderate rubra on pad   Extremities: trace pedal edema, no calf pain or tenderness; +2 DTRs bilaterally and no clonus bilaterally     A/P: PPD # 1, SVD  2nd degree perineal repair  Gestation Hypertension   - BPs stable   - Labs stable, negative for proteinuria on admission but proteinuria noted on office P/CR   - No neural s/s   Doing well - stable status  Routine post partum orders  Lactation support PRN   Encouraged to rest when baby rests  Anticipate d/c home tomorrow   Carlean Jews, MSN, CNM [facility]

## 2020-03-16 NOTE — Anesthesia Postprocedure Evaluation (Signed)
Anesthesia Post Note  Patient: Kim Wheeler  Procedure(s) Performed: AN AD HOC LABOR EPIDURAL     Patient location during evaluation: Mother Baby Anesthesia Type: Epidural Level of consciousness: awake Pain management: satisfactory to patient Vital Signs Assessment: post-procedure vital signs reviewed and stable Respiratory status: spontaneous breathing Cardiovascular status: stable Anesthetic complications: no   No complications documented.  Last Vitals:  Vitals:   03/15/20 2350 03/16/20 0538  BP: (!) 132/70 (!) 131/81  Pulse: 94 88  Resp: 18 17  Temp: 36.8 C 36.7 C  SpO2: 98% 99%    Last Pain:  Vitals:   03/16/20 0538  TempSrc: Oral  PainSc:    Pain Goal: Patients Stated Pain Goal: 0 (03/15/20 0411)                 Cephus Shelling

## 2020-03-17 MED ORDER — PRENATAL MULTIVITAMIN CH: 1.0000 | ORAL_TABLET | Freq: Every day | ORAL | Status: DC

## 2020-03-17 MED ORDER — BENZOCAINE-MENTHOL 20-0.5 % EX AERO: 1.0000 "application " | INHALATION_SPRAY | CUTANEOUS | Status: DC | PRN

## 2020-03-17 MED ORDER — COCONUT OIL OIL: 1.0000 "application " | TOPICAL_OIL | 0 refills | Status: DC | PRN

## 2020-03-17 MED ORDER — IBUPROFEN 600 MG PO TABS: 600.0000 mg | ORAL_TABLET | Freq: Four times a day (QID) | ORAL | 0 refills | Status: DC

## 2020-03-17 MED FILL — OMRON 3 SERIES BP MONITOR D: 1 days supply | Qty: 1 | Fill #0

## 2020-03-17 MED FILL — IBUPROFEN 600 MG TABLET: 600 | 7 days supply | Qty: 30 | Fill #0

## 2020-03-17 NOTE — Discharge Summary (Signed)
Postpartum Discharge Summary  Date of Service updated 03/17/2020     Patient Name: Kim Wheeler DOB: 1991-01-09 MRN: [id number]  Date of admission: 03/15/2020 Delivery date:03/15/2020  Delivering provider: Aloha Gell  Date of discharge: 03/17/2020  Admitting diagnosis: Labor and delivery indication for care or intervention [O75.9] Intrauterine pregnancy: [redacted]w[redacted]d    Secondary diagnosis:  Principal Problem:   Postpartum care following vaginal delivery 7/25 Active Problems:   Labor and delivery indication for care or intervention   SVD (spontaneous vaginal delivery) 7/25   Perineal laceration, second degree  Additional problems: Gestational hypertension    Discharge diagnosis: Term Pregnancy Delivered and Gestational Hypertension                                              Post partum procedures: n/a Augmentation: N/A Complications: None  Hospital course: Onset of Labor With Vaginal Delivery      29y.o. yo G1P0 at 332w6das admitted in Latent Labor on 03/15/2020. Patient had an uncomplicated labor course as follows:  Membrane Rupture Time/Date: 2:20 AM ,03/15/2020   Delivery Method:Vaginal, Spontaneous  Episiotomy: None  Lacerations:  2nd degree;Perineal  Patient had an uncomplicated postpartum course.  She is ambulating, tolerating a regular diet, passing flatus, and urinating well. Patient is discharged home in stable condition on 03/17/20.  Newborn Data: Birth date:03/15/2020  Birth time:3:54 PM  Gender:Female  Living status:Living  Apgars:8 ,9  Weight:3615 g   Magnesium Sulfate received: No BMZ received: No Rhophylac:N/A MMR:N/A T-DaP:Given prenatally Flu: No Transfusion:No  Physical exam  Vitals:   03/16/20 1000 03/16/20 1430 03/16/20 2200 03/17/20 0615  BP: (!) 127/87 128/84 (!) 135/89 127/84  Pulse: 90 68 87 65  Resp: '18 18 18 17  ' Temp: 98.7 F (37.1 C) 98.6 F (37 C) 98 F (36.7 C) 97.8 F (36.6 C)  TempSrc: Oral Oral Oral Oral  SpO2: 100%   100% 99%  Weight:      Height:       General: alert, cooperative and no distress Lochia: appropriate Uterine Fundus: firm Perineum: healing well, no edema DVT Evaluation: No evidence of DVT seen on physical exam. No significant calf/ankle edema. Labs: Lab Results  Component Value Date   WBC 13.0 (H) 03/15/2020   HGB 11.0 (L) 03/15/2020   HCT 32.5 (L) 03/15/2020   MCV 98.5 03/15/2020   PLT 222 03/15/2020   CMP Latest Ref Rng & Units 03/15/2020  Glucose 70 - 99 mg/dL 99  BUN 6 - 20 mg/dL <5(L)  Creatinine 0.44 - 1.00 mg/dL 0.60  Sodium 135 - 145 mmol/L 139  Potassium 3.5 - 5.1 mmol/L 3.4(L)  Chloride 98 - 111 mmol/L 108  CO2 22 - 32 mmol/L 22  Calcium 8.9 - 10.3 mg/dL 8.9  Total Protein 6.5 - 8.1 g/dL 6.1(L)  Total Bilirubin 0.3 - 1.2 mg/dL 0.9  Alkaline Phos 38 - 126 U/L 116  AST 15 - 41 U/L 22  ALT 0 - 44 U/L 18   Edinburgh Score: Edinburgh Postnatal Depression Scale Screening Tool 03/17/2020  I have been able to laugh and see the funny side of things. (No Data)      After visit meds:  Allergies as of 03/17/2020      Reactions   Drug [tape] Rash   Latex Rash   Wound Dressing Adhesive Dermatitis, Itching, Rash  Medication List    TAKE these medications   albuterol 108 (90 Base) MCG/ACT inhaler Commonly known as: VENTOLIN HFA Inhale 2 puffs into the lungs every 6 (six) hours as needed for wheezing or shortness of breath.   benzocaine-Menthol 20-0.5 % Aero Commonly known as: DERMOPLAST Apply 1 application topically as needed for irritation (perineal discomfort).   coconut oil Oil Apply 1 application topically as needed.   famotidine 20 MG tablet Commonly known as: PEPCID Take 20 mg by mouth 2 (two) times daily.   ibuprofen 600 MG tablet Commonly known as: ADVIL Take 1 tablet (600 mg total) by mouth every 6 (six) hours.   PreNatal DHA 200 MG Caps Generic drug: Docosahexaenoic Acid 1 tablet by mouth once daily What changed:   how much to  take  how to take this  when to take this  additional instructions   prenatal multivitamin Tabs tablet Take 1 tablet by mouth daily at 12 noon.            Discharge Care Instructions  (From admission, onward)         Start     Ordered   03/17/20 0000  Discharge wound care:       Comments: Warm water sitz baths as needed   03/17/20 1113           Discharge home in stable condition Infant Feeding: Breast Infant Disposition:home with mother Discharge instruction: per After Visit Summary and Postpartum booklet. Activity: Advance as tolerated. Pelvic rest for 6 weeks.  Diet: low salt diet Anticipated Birth Control: Unsure Postpartum Appointment:6 weeks Additional Postpartum F/U: BP check 1 week Future Appointments:No future appointments. Follow up Visit:  Follow-up Information    Aloha Gell, MD. Schedule an appointment as soon as possible for a visit in 1 week(s).   Specialty: Obstetrics and Gynecology Why: Postpartum blood pressure check Contact information: [place] [address] [phone]                   03/17/2020 Darliss Cheney, CNM

## 2020-03-17 NOTE — Lactation Note (Signed)
This note was copied from a baby's chart. Lactation Consultation Note  Patient Name: Kim Wheeler [id number] Date: 03/17/2020 Reason for consult: Follow-up assessment  Mother is a P34, infant is 17 hours old and is now at 7 % wt loss.   Mother reports that infant is feeding well. Mother reports that infant cluster fed during the night. Infant is still lying in mothers arms STS and cueing. Mother reports that she still has a tiny blood blister on the RT nipple. Observed and no opening noted. Assist mother with hand expression and observed milk flowing.  Mother has harmony hand pump . She ask to check the flange size and the #24 fit well. Observed large drops of milk when expresses.  Mother still has not decided on which Medela pump she wants as a Producer, television/film/video. She will page when ready to go home. Also advised mother to page when infant feeds again to see latch and observe for milk transfer   Discussed treatment and prevention of engorgement.   Mother to continue to cue base feed infant and feed at least 8-12 times or more in 24 hours and advised to allow for cluster feeding infant as needed.   Mother to continue to due STS. Mother is aware of available LC services at [facility], BFSG'S, OP Dept, and phone # for questions or concerns about breastfeeding.  Mother receptive to all teaching and plan of care.    Maternal Data    Feeding Feeding Type: Breast Fed  LATCH Score Latch: Grasps breast easily, tongue down, lips flanged, rhythmical sucking.  Audible Swallowing: A few with stimulation  Type of Nipple: Everted at rest and after stimulation  Comfort (Breast/Nipple): Soft / non-tender  Hold (Positioning): No assistance needed to correctly position infant at breast.  LATCH Score: 9  Interventions Interventions: Assisted with latch;Skin to skin;Breast massage;Hand express;Adjust position;Hand pump;Ice  Lactation Tools Discussed/Used     Consult Status Consult Status:  Complete    Michel Bickers 03/17/2020, 9:54 AM

## 2020-03-24 MED FILL — NIFEDIPINE ER 30 MG TABLET: 30 | 30 days supply | Qty: 30 | Fill #0

## 2020-03-25 ENCOUNTER — Inpatient Hospital Stay (HOSPITAL_COMMUNITY): Admission: AD | Admit: 2020-03-25 | Payer: No Typology Code available for payment source | Source: Home / Self Care

## 2020-04-28 ENCOUNTER — Other Ambulatory Visit (HOSPITAL_BASED_OUTPATIENT_CLINIC_OR_DEPARTMENT_OTHER): Payer: Self-pay | Admitting: Obstetrics

## 2020-04-28 LAB — HM PAP SMEAR: HM Pap smear: NEGATIVE

## 2020-04-28 MED FILL — NORETHINDRONE 0.35 MG TABS: 0.35 | 84 days supply | Qty: 84 | Fill #0

## 2020-05-05 ENCOUNTER — Ambulatory Visit: Payer: Self-pay

## 2020-05-05 NOTE — Lactation Note (Signed)
This note was copied from a baby's chart. Lactation Consultation Note  Patient Name: Kim Wheeler Date: 05/05/2020 Reason for consult: Follow-up assessment;Mother's request;Other (Comment) (Outpatient)  Mom and Kim Wheeler in for outpatient consult. Mom would like feeding assessment due to Kim Wheeler's behavior at the breast and tips for bottle acceptance in preparation for returning to work. Kim Wheeler is feeding on demand, mom utilizing DEBP through insurance PRN to establish a pre-pumped supply and for bottle introduction. Parents report Kim Wheeler choking, gagging at the breast, as well as reluctant for bottle acceptance. Feeding Assessment:  Pre-feed weight: 5208g Kim Wheeler latched to right breast independently in good alignment and position, stayed at breast without unlatching for 7 minutes with gulping noted, returned to breast easily and unlatched independently 6 minutes later. Mom notes no pain/discomfort, nipples remained round pre/post latch. Post weight: 5276 Transfer of 34mL Kim Wheeler remained alert but non-cuing for several minutes. LC discussed with parents potential for fast or intense let down, discussed ways to minimize for Kim Wheeler: position, C-hold at breast, better support with pillow underneath, and pre-pumping past let down as options to trial. LC also discussed tips/strategies for bottle introduction and acceptance: consistency of introduction, timing, partner or family member providing bottle, different bottles and/or nipples- rubber nipples given to trial. Discussed paced bottle feeding positioning and technique. Kim Wheeler returned to breast (left), where she BF for additional 6 minutes, with having a wet/stool diaper while feeding. While at the breast both LC and mom note a clicking sound. Oral assessment performed: LC notes feeling of gum ridge at times but not consistently, baby can trace finger to side, but not all the way-potential for slight restriction; however baby is gaining weight  well and transferring well. Post feed weight (with diaper): 5310 Total transfer of feeding within 19 minutes: Parents feel that all questions have been addressed, and will plan to implement bottle strategies immediately.  Maternal Data Formula Feeding for Exclusion: No Does the patient have breastfeeding experience prior to this delivery?: No  Feeding Feeding Type: Breast Fed  LATCH Score Latch: Grasps breast easily, tongue down, lips flanged, rhythmical sucking.  Audible Swallowing: Spontaneous and intermittent (gulping)  Type of Nipple: Everted at rest and after stimulation  Comfort (Breast/Nipple): Soft / non-tender  Hold (Positioning): No assistance needed to correctly position infant at breast.  LATCH Score: 10  Interventions Interventions: Breast feeding basics reviewed;Adjust position;DEBP (Oral assessment)  Lactation Tools Discussed/Used   Bottle, rubber nipple, hand support(c-hold), laid-back position, pre-pump if needed  Consult Status Consult Status: Complete    Kim Wheeler 05/05/2020, 3:57 PM

## 2020-07-27 MED FILL — NORETHINDRONE 0.35 MG TABS: 0.35 | 84 days supply | Qty: 84 | Fill #1

## 2020-09-25 ENCOUNTER — Ambulatory Visit: Payer: Self-pay | Attending: Internal Medicine

## 2020-09-25 DIAGNOSIS — Z23 Encounter for immunization: Secondary | ICD-10-CM

## 2020-09-25 NOTE — Progress Notes (Signed)
   Covid-19 Vaccination Clinic  Name:  CHRYSTINE FROGGE    MRN: [id number] DOB: Jan 29, 1991  09/25/2020  Ms. Binney was observed post Covid-19 immunization for 15 minutes without incident. She was provided with Vaccine Information Sheet and instruction to access the V-Safe system.   Ms. Previti was instructed to call 911 with any severe reactions post vaccine: Marland Kitchen Difficulty breathing  . Swelling of face and throat  . A fast heartbeat  . A bad rash all over body  . Dizziness and weakness   Immunizations Administered    Name Date Dose VIS Date Route   Moderna Covid-19 Booster Vaccine 09/25/2020 12:22 PM 0.25 mL 06/10/2020 Intramuscular   Manufacturer: Moderna   Lot: [id number]   NDC: 57972-820-60

## 2020-10-12 ENCOUNTER — Other Ambulatory Visit (HOSPITAL_BASED_OUTPATIENT_CLINIC_OR_DEPARTMENT_OTHER): Payer: Self-pay | Admitting: Obstetrics

## 2020-10-12 MED FILL — NORETHINDRONE 0.35 MG TABS: 0.35 | 84 days supply | Qty: 84 | Fill #0

## 2020-10-27 ENCOUNTER — Ambulatory Visit (INDEPENDENT_AMBULATORY_CARE_PROVIDER_SITE_OTHER): Payer: 59 | Admitting: Family

## 2020-10-27 ENCOUNTER — Encounter: Payer: Self-pay | Admitting: Family

## 2020-10-27 ENCOUNTER — Other Ambulatory Visit: Payer: Self-pay

## 2020-10-27 ENCOUNTER — Telehealth: Payer: Self-pay | Admitting: Family

## 2020-10-27 VITALS — BP 100/58 | HR 88 | Ht 67.0 in | Wt 117.8 lb

## 2020-10-27 DIAGNOSIS — Z0000 Encounter for general adult medical examination without abnormal findings: Secondary | ICD-10-CM | POA: Diagnosis not present

## 2020-10-27 DIAGNOSIS — D649 Anemia, unspecified: Secondary | ICD-10-CM | POA: Diagnosis not present

## 2020-10-27 DIAGNOSIS — E876 Hypokalemia: Secondary | ICD-10-CM

## 2020-10-27 NOTE — Telephone Encounter (Signed)
Please call Dr. Roseanne Kaufman office and request copy of last tetanus and pap.

## 2020-10-27 NOTE — Telephone Encounter (Signed)
Records release faxed to [facility]

## 2020-10-27 NOTE — Patient Instructions (Addendum)
Please send a copy of your covid vaccine card via mychart.    Preventive Care 31-31 Years Old, Female Preventive care refers to lifestyle choices and visits with your health care provider that can promote health and wellness. This includes:  A yearly physical exam. This is also called an annual wellness visit.  Regular dental and eye exams.  Immunizations.  Screening for certain conditions.  Healthy lifestyle choices, such as: ? Eating a healthy diet. ? Getting regular exercise. ? Not using drugs or products that contain nicotine and tobacco. ? Limiting alcohol use. What can I expect for my preventive care visit? Physical exam Your health care provider may check your:  Height and weight. These may be used to calculate your BMI (body mass index). BMI is a measurement that tells if you are at a healthy weight.  Heart rate and blood pressure.  Body temperature.  Skin for abnormal spots. Counseling Your health care provider may ask you questions about your:  Past medical problems.  Family's medical history.  Alcohol, tobacco, and drug use.  Emotional well-being.  Home life and relationship well-being.  Sexual activity.  Diet, exercise, and sleep habits.  Work and work Statistician.  Access to firearms.  Method of birth control.  Menstrual cycle.  Pregnancy history. What immunizations do I need? Vaccines are usually given at various ages, according to a schedule. Your health care provider will recommend vaccines for you based on your age, medical history, and lifestyle or other factors, such as travel or where you work.   What tests do I need? Blood tests  Lipid and cholesterol levels. These may be checked every 5 years starting at age 31.  Hepatitis C test.  Hepatitis B test. Screening  Diabetes screening. This is done by checking your blood sugar (glucose) after you have not eaten for a while (fasting).  STD (sexually transmitted disease) testing, if  you are at risk.  BRCA-related cancer screening. This may be done if you have a family history of breast, ovarian, tubal, or peritoneal cancers.  Pelvic exam and Pap test. This may be done every 3 years starting at age 31. Starting at age 37, this may be done every 5 years if you have a Pap test in combination with an HPV test. Talk with your health care provider about your test results, treatment options, and if necessary, the need for more tests.   Follow these instructions at home: Eating and drinking  Eat a healthy diet that includes fresh fruits and vegetables, whole grains, lean protein, and low-fat dairy products.  Take vitamin and mineral supplements as recommended by your health care provider.  Do not drink alcohol if: ? Your health care provider tells you not to drink. ? You are pregnant, may be pregnant, or are planning to become pregnant.  If you drink alcohol: ? Limit how much you have to 0-1 drink a day. ? Be aware of how much alcohol is in your drink. In the [place] one drink equals one 12 oz bottle of beer (355 mL), one 5 oz glass of wine (148 mL), or one 1 oz glass of hard liquor (44 mL).   Lifestyle  Take daily care of your teeth and gums. Brush your teeth every morning and night with fluoride toothpaste. Floss one time each day.  Stay active. Exercise for at least 30 minutes 5 or more days each week.  Do not use any products that contain nicotine or tobacco, such as cigarettes, e-cigarettes, and chewing  tobacco. If you need help quitting, ask your health care provider.  Do not use drugs.  If you are sexually active, practice safe sex. Use a condom or other form of protection to prevent STIs (sexually transmitted infections).  If you do not wish to become pregnant, use a form of birth control. If you plan to become pregnant, see your health care provider for a prepregnancy visit.  Find healthy ways to cope with stress, such as: ? Meditation, yoga, or listening to  music. ? Journaling. ? Talking to a trusted person. ? Spending time with friends and family. Safety  Always wear your seat belt while driving or riding in a vehicle.  Do not drive: ? If you have been drinking alcohol. Do not ride with someone who has been drinking. ? When you are tired or distracted. ? While texting.  Wear a helmet and other protective equipment during sports activities.  If you have firearms in your house, make sure you follow all gun safety procedures.  Seek help if you have been physically or sexually abused. What's next?  Go to your health care provider once a year for an annual wellness visit.  Ask your health care provider how often you should have your eyes and teeth checked.  Stay up to date on all vaccines. This information is not intended to replace advice given to you by your health care provider. Make sure you discuss any questions you have with your health care provider. Document Revised: 04/05/2020 Document Reviewed: 04/19/2018 Elsevier Patient Education  2021 [employer].

## 2020-10-27 NOTE — Progress Notes (Signed)
Subjective:    Patient ID: Kim Wheeler, female    DOB: [birth year], 30 y.o.   MRN: [id number]  HPI  Patient presents today for complete physical.  Immunizations:  Tetanus up to date Diet: fair diet Wt Readings from Last 3 Encounters:  10/27/20 117 lb 12.8 oz (53.4 kg)  03/15/20 167 lb 4.8 oz (75.9 kg)  09/24/19 124 lb (56.2 kg)  she is breast feeding.   Exercise: walks Pap Smear: 05/05/20 Mammogram: Vision: up to date Dental: up to date  Reports that she had pregnancy induced HTN  Review of Systems  Constitutional: Negative for unexpected weight change.  HENT: Negative for rhinorrhea.   Respiratory: Negative for cough and shortness of breath.   Cardiovascular: Negative for chest pain.  Gastrointestinal: Negative for constipation and diarrhea.  Genitourinary: Negative for dysuria, frequency and hematuria.  Musculoskeletal: Negative for arthralgias and myalgias.  Skin: Negative for rash.  Neurological: Negative for syncope and headaches.  Hematological: Negative for adenopathy.  Psychiatric/Behavioral:       Denies depression/anxiety.    Past Medical History:  Diagnosis Date  . Conjunctivitis      Social History   Socioeconomic History  . Marital status: Married    Spouse name: Not on file  . Number of children: Not on file  . Years of education: Not on file  . Highest education level: Not on file  Occupational History  . Not on file  Tobacco Use  . Smoking status: Never Smoker  . Smokeless tobacco: Never Used  Substance and Sexual Activity  . Alcohol use: Not Currently  . Drug use: No  . Sexual activity: Yes    Birth control/protection: None  Other Topics Concern  . 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 EYE SURGERY Bilateral 1993   strabismus     Family History  Problem Relation Age of Onset  . Cancer Mother        breast cancer  . Hypertension Mother   . Gout Father   . Dementia Maternal Grandmother   . Stroke Maternal Grandmother   . Breast cancer Maternal Grandmother     Allergies  Allergen Reactions  . Drug [Tape] Rash  . Latex Rash  . Wound Dressing Adhesive Dermatitis, Itching and Rash    Current Outpatient Medications on File Prior to Visit  Medication Sig Dispense Refill  . Prenatal Vit-Fe Fumarate-FA (PRENATAL MULTIVITAMIN) TABS tablet Take 1 tablet by mouth daily at 12 noon.     No current facility-administered medications on file prior to visit.    BP (!) 100/58   Pulse 88   Ht 5\' 7"  (1.702 m)   Wt 117 lb 12.8 oz (53.4 kg)   SpO2 99%   BMI 18.45 kg/m       Objective:   Physical Exam  Physical Exam  Constitutional: She is oriented to person, place, and time. She appears well-developed and well-nourished. No distress.  HENT:  Head: Normocephalic and atraumatic.  Right Ear: Tympanic membrane and ear canal normal.  Left Ear: Tympanic membrane and ear canal normal.  Mouth/Throat: Not examined- pt wearing mask Eyes: Pupils are equal, round, and reactive to light. No scleral icterus.  Neck: Normal range of motion. No thyromegaly present.  Cardiovascular: Normal rate and regular rhythm.   No murmur heard. Pulmonary/Chest: Effort normal and breath sounds normal. No respiratory distress. He has no wheezes. She has no rales. She exhibits no tenderness.  Abdominal: Soft. Bowel sounds are normal. She exhibits no distension and no mass. There is no tenderness. There is no rebound and no guarding.  Musculoskeletal: She exhibits no edema.   Lymphadenopathy:    She has no cervical adenopathy.  Neurological: She is alert and oriented to person, place, and time. She has normal patellar reflexes. She exhibits normal muscle tone. Coordination normal.  Skin: Skin is warm and dry.  Psychiatric: She has a normal mood and affect. Her behavior is normal. Judgment and thought content normal.  Breast/pelvic: deferred        Assessment & Plan:   Preventative care- she is underweight due to recent breastfeeding. She states her weight got down to 113 lbs. I advised her to focus on healthy, high calorie foods such as peanut butter, nuts, dried fruits. Will request copy of her pap and tetanus from GYN.    This visit occurred during the SARS-CoV-2 public health emergency.  Safety protocols were in place, including screening questions prior to the visit, additional usage of staff PPE, and extensive cleaning of exam room while observing appropriate contact time as indicated for disinfecting solutions.       Assessment & Plan:

## 2020-10-28 LAB — CBC WITH DIFFERENTIAL/PLATELET
Basophils Absolute: 0.1 10*3/uL (ref 0.0–0.1)
Basophils Relative: 0.7 % (ref 0.0–3.0)
Eosinophils Absolute: 0.1 10*3/uL (ref 0.0–0.7)
Eosinophils Relative: 1.8 % (ref 0.0–5.0)
HCT: 37.1 % (ref 36.0–46.0)
Hemoglobin: 12.9 g/dL (ref 12.0–15.0)
Lymphocytes Relative: 34 % (ref 12.0–46.0)
Lymphs Abs: 2.7 10*3/uL (ref 0.7–4.0)
MCHC: 34.7 g/dL (ref 30.0–36.0)
MCV: 92.7 fl (ref 78.0–100.0)
Monocytes Absolute: 0.5 10*3/uL (ref 0.1–1.0)
Monocytes Relative: 6.3 % (ref 3.0–12.0)
Neutro Abs: 4.6 10*3/uL (ref 1.4–7.7)
Neutrophils Relative %: 57.2 % (ref 43.0–77.0)
Platelets: 304 10*3/uL (ref 150.0–400.0)
RBC: 4 Mil/uL (ref 3.87–5.11)
RDW: 12.3 % (ref 11.5–15.5)
WBC: 8 10*3/uL (ref 4.0–10.5)

## 2020-10-28 LAB — COMPREHENSIVE METABOLIC PANEL
ALT: 10 U/L (ref 0–35)
AST: 13 U/L (ref 0–37)
Albumin: 4.6 g/dL (ref 3.5–5.2)
Alkaline Phosphatase: 80 U/L (ref 39–117)
BUN: 15 mg/dL (ref 6–23)
CO2: 28 mEq/L (ref 19–32)
Calcium: 9.6 mg/dL (ref 8.4–10.5)
Chloride: 104 mEq/L (ref 96–112)
Creatinine, Ser: 0.69 mg/dL (ref 0.40–1.20)
GFR: 116.1 mL/min (ref >60.00)
Glucose, Bld: 90 mg/dL (ref 70–99)
Potassium: 4.1 mEq/L (ref 3.5–5.1)
Sodium: 139 mEq/L (ref 135–145)
Total Bilirubin: 1 mg/dL (ref 0.2–1.2)
Total Protein: 7.7 g/dL (ref 6.0–8.3)

## 2020-11-09 ENCOUNTER — Encounter: Payer: Self-pay | Admitting: Family

## 2020-11-10 MED ORDER — NORETHINDRONE 0.35 MG PO TABS: 1.0000 | ORAL_TABLET | Freq: Every day | ORAL | 11 refills | Status: DC

## 2020-11-19 ENCOUNTER — Other Ambulatory Visit (HOSPITAL_BASED_OUTPATIENT_CLINIC_OR_DEPARTMENT_OTHER): Payer: Self-pay | Admitting: Obstetrics

## 2020-11-19 DIAGNOSIS — Z803 Family history of malignant neoplasm of breast: Secondary | ICD-10-CM | POA: Diagnosis not present

## 2020-11-19 DIAGNOSIS — Z3041 Encounter for surveillance of contraceptive pills: Secondary | ICD-10-CM | POA: Diagnosis not present

## 2020-11-19 DIAGNOSIS — N952 Postmenopausal atrophic vaginitis: Secondary | ICD-10-CM | POA: Diagnosis not present

## 2020-11-19 DIAGNOSIS — N9411 Superficial (introital) dyspareunia: Secondary | ICD-10-CM | POA: Diagnosis not present

## 2020-11-19 DIAGNOSIS — Z01419 Encounter for gynecological examination (general) (routine) without abnormal findings: Secondary | ICD-10-CM | POA: Diagnosis not present

## 2020-11-19 DIAGNOSIS — Z681 Body mass index (BMI) 19 or less, adult: Secondary | ICD-10-CM | POA: Diagnosis not present

## 2020-11-19 DIAGNOSIS — Z113 Encounter for screening for infections with a predominantly sexual mode of transmission: Secondary | ICD-10-CM | POA: Diagnosis not present

## 2020-11-19 DIAGNOSIS — Z01411 Encounter for gynecological examination (general) (routine) with abnormal findings: Secondary | ICD-10-CM | POA: Diagnosis not present

## 2020-11-19 DIAGNOSIS — Z124 Encounter for screening for malignant neoplasm of cervix: Secondary | ICD-10-CM | POA: Diagnosis not present

## 2020-11-21 ENCOUNTER — Other Ambulatory Visit (HOSPITAL_BASED_OUTPATIENT_CLINIC_OR_DEPARTMENT_OTHER): Payer: Self-pay

## 2020-11-21 MED FILL — Estrogens, Conjugated Vaginal Cream 0.625 MG/GM: VAGINAL | 30 days supply | Qty: 30 | Fill #0 | Status: CN

## 2020-11-23 ENCOUNTER — Other Ambulatory Visit (HOSPITAL_BASED_OUTPATIENT_CLINIC_OR_DEPARTMENT_OTHER): Payer: Self-pay

## 2020-11-23 MED FILL — Estrogens, Conjugated Vaginal Cream 0.625 MG/GM: VAGINAL | 14 days supply | Qty: 30 | Fill #0 | Status: CN

## 2021-01-05 ENCOUNTER — Other Ambulatory Visit (HOSPITAL_COMMUNITY): Payer: Self-pay

## 2021-01-05 MED FILL — Norethindrone Tab 0.35 MG: ORAL | 84 days supply | Qty: 84 | Fill #0 | Status: AC

## 2021-03-31 DIAGNOSIS — H5202 Hypermetropia, left eye: Secondary | ICD-10-CM | POA: Diagnosis not present

## 2021-03-31 DIAGNOSIS — H5211 Myopia, right eye: Secondary | ICD-10-CM | POA: Diagnosis not present

## 2021-04-05 ENCOUNTER — Other Ambulatory Visit (HOSPITAL_COMMUNITY): Payer: Self-pay

## 2021-04-05 MED FILL — Norethindrone Tab 0.35 MG: ORAL | 84 days supply | Qty: 84 | Fill #1 | Status: AC

## 2021-06-28 MED FILL — Norethindrone Tab 0.35 MG: ORAL | 84 days supply | Qty: 84 | Fill #2 | Status: AC

## 2021-06-29 ENCOUNTER — Other Ambulatory Visit (HOSPITAL_COMMUNITY): Payer: Self-pay

## 2021-09-21 ENCOUNTER — Other Ambulatory Visit (HOSPITAL_COMMUNITY): Payer: Self-pay

## 2021-09-21 MED FILL — Norethindrone Tab 0.35 MG: ORAL | 84 days supply | Qty: 84 | Fill #3 | Status: AC

## 2021-10-29 ENCOUNTER — Encounter: Payer: 59 | Admitting: Family

## 2021-11-01 ENCOUNTER — Ambulatory Visit (INDEPENDENT_AMBULATORY_CARE_PROVIDER_SITE_OTHER): Payer: 59 | Admitting: Family

## 2021-11-01 ENCOUNTER — Encounter: Payer: Self-pay | Admitting: Family

## 2021-11-01 DIAGNOSIS — Z0000 Encounter for general adult medical examination without abnormal findings: Secondary | ICD-10-CM

## 2021-11-01 NOTE — Patient Instructions (Signed)
Continue healthy diet and regular exercise.  

## 2021-11-01 NOTE — Progress Notes (Signed)
Subjective:   By signing my name below, I, Zite Okoli, attest that this documentation has been prepared under the direction and in the presence of Sandford CrazeO'Sullivan, Daekwon Beswick, NP 11/01/2021      Patient ID: Kim Wheeler, female    DOB: 03-24-1991, 31 y.o.   MRN: [id number]  Chief Complaint  Patient presents with   Annual Exam    HPI Patient is in today for a comprehensive physical exam.  Menstrual cycles- She recently started her periods again since the birth of her child in 2021. Pap smear- Last checked on 04/28/2020. Results were normal. Repeat in 3-5 years. Immunizations- She has received the flu vaccine. She has 4 Covid-19 vaccines at this time.  Diet and Exercise- She is trying to manage a healthy diet. She exercises by walking her dog. Social history- She does not drink alcohol. She does not vape or use tobacco products.  Dental and Vision- She is UTD on dental and vision.   No changes to family medical history.  Past Medical History:  Diagnosis Date   Conjunctivitis    Preeclampsia 2021   "mild"    Pregnancy induced hypertension 2021    Past Surgical History:  Procedure Laterality Date   EYE SURGERY Bilateral 1993   strabismus     Family History  Problem Relation Age of Onset   Cancer Mother        breast cancer   Hypertension Mother    Gout Father    Dementia Maternal Grandmother    Stroke Maternal Grandmother    Breast cancer Maternal Grandmother    Hypothyroidism Maternal Grandfather    CAD Paternal Grandfather        MI x 3    Social History   Socioeconomic History   Marital status: Married    Spouse name: Not on file   Number of children: Not on file   Years of education: Not on file   Highest education level: Not on file  Occupational History   Not on file  Tobacco Use   Smoking status: Never   Smokeless tobacco: Never  Substance and Sexual Activity   Alcohol use: Not Currently   Drug use: No   Sexual activity: Yes    Partners: Male     Birth control/protection: None, Pill  Other Topics Concern   Not on file  Social History Narrative   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 28 tablet 11   conjugated estrogens (PREMARIN) vaginal cream PLACE BLUEBERRY SIZED DROP INTO VAGINA NIGHTLY FOR 2 WEEKS THEN TWICE WEEKLY. 30 g 0   norethindrone (MICRONOR) 0.35 MG tablet TAKE 1 TABLET BY MOUTH ONCE DAILY 90 tablet 3   norethindrone (MICRONOR) 0.35 MG tablet TAKE 1 TABLET BY MOUTH ONCE DAILY 84 tablet 0   norethindrone (MICRONOR) 0.35 MG tablet TAKE 1 TABLET BY MOUTH DAILY 84 tablet 1  Prenatal Vit-Fe Fumarate-FA (PRENATAL MULTIVITAMIN) TABS tablet Take 1 tablet by mouth daily at 12 noon.     No facility-administered medications prior to visit.    Allergies  Allergen Reactions   Drug [Tape] Rash   Latex Rash   Wound Dressing Adhesive Dermatitis, Itching and Rash    Review of Systems  Constitutional:  Negative for fever.  HENT:  Negative for ear pain and hearing loss.        (-)nystagmus (-)adenopathy  Eyes:  Negative for blurred vision.  Respiratory:  Negative for cough, shortness of breath and wheezing.   Cardiovascular:  Negative for chest pain and leg swelling.  Gastrointestinal:  Negative for blood in stool, diarrhea, nausea and vomiting.  Genitourinary:  Negative for dysuria and frequency.  Musculoskeletal:  Negative for joint pain and myalgias.  Skin:  Negative for rash.   Neurological:  Negative for headaches.  Psychiatric/Behavioral:  Negative for depression. The patient is not nervous/anxious.       Objective:    Physical Exam Constitutional:      General: She is not in acute distress.    Appearance: Normal appearance. She is not ill-appearing.  HENT:     Head: Normocephalic and atraumatic.     Right Ear: Tympanic membrane, ear canal and external ear normal.     Left Ear: Tympanic membrane, ear canal and external ear normal.  Eyes:     Extraocular Movements: Extraocular movements intact.     Right eye: No nystagmus.     Left eye: No nystagmus.     Pupils: Pupils are equal, round, and reactive to light.  Cardiovascular:     Rate and Rhythm: Normal rate and regular rhythm.     Pulses: Normal pulses.     Heart sounds: Normal heart sounds. No murmur heard. Pulmonary:     Effort: Pulmonary effort is normal. No respiratory distress.     Breath sounds: Normal breath sounds. No wheezing or rhonchi.  Abdominal:     General: Bowel sounds are normal. There is no distension.     Palpations: Abdomen is soft.     Tenderness: There is no abdominal tenderness. There is no guarding or rebound.  Musculoskeletal:     Cervical back: Neck supple.     Comments: 5/5 strength in upper and lower extremities   Lymphadenopathy:     Cervical: No cervical adenopathy.  Skin:    General: Skin is warm and dry.  Neurological:     Mental Status: She is alert and oriented to person, place, and time.     Deep Tendon Reflexes:     Reflex Scores:      Patellar reflexes are 2+ on the right side and 2+ on the left side. Psychiatric:        Behavior: Behavior normal.        Judgment: Judgment normal.    BP 109/69 (BP Location: Right Arm, Patient Position: Sitting, Cuff Size: Small)    Pulse 77    Temp 98.4 F (36.9 C) (Oral)    Resp 16    Ht 5\' 7"  (1.702 m)    Wt 121 lb 3.2 oz (55 kg)    SpO2 99%    BMI 18.98 kg/m  Wt Readings from Last 3 Encounters:  11/01/21 121 lb  3.2 oz (55 kg)  10/27/20 117 lb 12.8 oz (53.4 kg)  03/15/20 167 lb 4.8 oz (75.9 kg)    Diabetic Foot Exam - Simple   No data filed  Lab Results  Component Value Date   WBC 8.0 10/27/2020   HGB 12.9 10/27/2020   HCT 37.1 10/27/2020   PLT 304.0 10/27/2020   GLUCOSE 90 10/27/2020   CHOL 136 09/30/2019   TRIG 132.0 09/30/2019   HDL 54.00 09/30/2019   LDLCALC 56 09/30/2019   ALT 10 10/27/2020   AST 13 10/27/2020   NA 139 10/27/2020   K 4.1 10/27/2020   CL 104 10/27/2020   CREATININE 0.69 10/27/2020   BUN 15 10/27/2020   CO2 28 10/27/2020   TSH 2.09 09/30/2019    Lab Results  Component Value Date   TSH 2.09 09/30/2019   Lab Results  Component Value Date   WBC 8.0 10/27/2020   HGB 12.9 10/27/2020   HCT 37.1 10/27/2020   MCV 92.7 10/27/2020   PLT 304.0 10/27/2020   Lab Results  Component Value Date   NA 139 10/27/2020   K 4.1 10/27/2020   CO2 28 10/27/2020   GLUCOSE 90 10/27/2020   BUN 15 10/27/2020   CREATININE 0.69 10/27/2020   BILITOT 1.0 10/27/2020   ALKPHOS 80 10/27/2020   AST 13 10/27/2020   ALT 10 10/27/2020   PROT 7.7 10/27/2020   ALBUMIN 4.6 10/27/2020   CALCIUM 9.6 10/27/2020   ANIONGAP 9 03/15/2020   GFR 116.10 10/27/2020   Lab Results  Component Value Date   CHOL 136 09/30/2019   Lab Results  Component Value Date   HDL 54.00 09/30/2019   Lab Results  Component Value Date   LDLCALC 56 09/30/2019   Lab Results  Component Value Date   TRIG 132.0 09/30/2019   Lab Results  Component Value Date   CHOLHDL 3 09/30/2019   No results found for: HGBA1C     Assessment & Plan:   Problem List Items Addressed This Visit       Unprioritized   Preventative health care    Wt Readings from Last 3 Encounters:  11/01/21 121 lb 3.2 oz (55 kg)  10/27/20 117 lb 12.8 oz (53.4 kg)  03/15/20 167 lb 4.8 oz (75.9 kg)  Continue healthy diet and regular exercise.  Lab work was normal last year.  Pap up to date. Immunizations reviewed and up to  date.         No orders of the defined types were placed in this encounter.   I,Zite Okoli,acting as a Neurosurgeon for [employer], NP.,have documented all relevant documentation on the behalf of Lemont Fillers, NP,as directed by  Lemont Fillers, NP while in the presence of Lemont Fillers, NP.   I, Sandford Craze, NP , personally preformed the services described in this documentation.  All medical record entries made by the scribe were at my direction and in my presence.  I have reviewed the chart and discharge instructions (if applicable) and agree that the record reflects my personal performance and is accurate and complete. 11/01/2021

## 2021-11-01 NOTE — Assessment & Plan Note (Addendum)
Wt Readings from Last 3 Encounters:  ?11/01/21 121 lb 3.2 oz (55 kg)  ?10/27/20 117 lb 12.8 oz (53.4 kg)  ?03/15/20 167 lb 4.8 oz (75.9 kg)  ? ?Continue healthy diet and regular exercise.  Lab work was normal last year.  Pap up to date. Immunizations reviewed and up to date.  ?

## 2021-12-13 ENCOUNTER — Other Ambulatory Visit: Payer: Self-pay

## 2021-12-13 ENCOUNTER — Other Ambulatory Visit (HOSPITAL_COMMUNITY): Payer: Self-pay

## 2021-12-13 MED ORDER — NORETHINDRONE 0.35 MG PO TABS
ORAL_TABLET | ORAL | 0 refills | Status: DC
  Filled 2021-12-13: qty 84, 84d supply, fill #0

## 2021-12-15 ENCOUNTER — Other Ambulatory Visit (HOSPITAL_COMMUNITY): Payer: Self-pay

## 2021-12-20 ENCOUNTER — Other Ambulatory Visit (HOSPITAL_COMMUNITY): Payer: Self-pay

## 2021-12-20 DIAGNOSIS — Z681 Body mass index (BMI) 19 or less, adult: Secondary | ICD-10-CM | POA: Diagnosis not present

## 2021-12-20 DIAGNOSIS — L292 Pruritus vulvae: Secondary | ICD-10-CM | POA: Diagnosis not present

## 2021-12-20 DIAGNOSIS — Z01419 Encounter for gynecological examination (general) (routine) without abnormal findings: Secondary | ICD-10-CM | POA: Diagnosis not present

## 2021-12-20 DIAGNOSIS — Z803 Family history of malignant neoplasm of breast: Secondary | ICD-10-CM | POA: Diagnosis not present

## 2021-12-20 DIAGNOSIS — Z3041 Encounter for surveillance of contraceptive pills: Secondary | ICD-10-CM | POA: Diagnosis not present

## 2021-12-20 MED ORDER — NORETHINDRONE ACET-ETHINYL EST 1-20 MG-MCG PO TABS
ORAL_TABLET | ORAL | 3 refills | Status: DC
  Filled 2021-12-20: qty 84, 84d supply, fill #0
  Filled 2022-03-12: qty 84, 84d supply, fill #1
  Filled 2022-06-02: qty 84, 84d supply, fill #2
  Filled 2022-08-28: qty 84, 84d supply, fill #3

## 2021-12-20 MED ORDER — CLOBETASOL PROPIONATE 0.05 % EX OINT
TOPICAL_OINTMENT | CUTANEOUS | 0 refills | Status: DC
  Filled 2021-12-20: qty 30, 10d supply, fill #0

## 2021-12-21 ENCOUNTER — Other Ambulatory Visit (HOSPITAL_COMMUNITY): Payer: Self-pay

## 2022-03-12 ENCOUNTER — Other Ambulatory Visit (HOSPITAL_COMMUNITY): Payer: Self-pay

## 2022-04-19 DIAGNOSIS — H5211 Myopia, right eye: Secondary | ICD-10-CM | POA: Diagnosis not present

## 2022-06-02 ENCOUNTER — Other Ambulatory Visit (HOSPITAL_COMMUNITY): Payer: Self-pay

## 2022-06-15 ENCOUNTER — Other Ambulatory Visit (HOSPITAL_BASED_OUTPATIENT_CLINIC_OR_DEPARTMENT_OTHER): Payer: Self-pay

## 2022-06-15 MED ORDER — COMIRNATY 30 MCG/0.3ML IM SUSY
PREFILLED_SYRINGE | INTRAMUSCULAR | 0 refills | Status: DC
  Filled 2022-06-15: qty 0.3, 1d supply, fill #0

## 2022-11-01 ENCOUNTER — Ambulatory Visit (INDEPENDENT_AMBULATORY_CARE_PROVIDER_SITE_OTHER): Payer: 59 | Admitting: Family

## 2022-11-01 ENCOUNTER — Encounter: Payer: Self-pay | Admitting: Family

## 2022-11-01 VITALS — BP 120/82 | HR 73 | Temp 97.2°F | Resp 16 | Ht 67.0 in | Wt 134.0 lb

## 2022-11-01 DIAGNOSIS — Z0000 Encounter for general adult medical examination without abnormal findings: Secondary | ICD-10-CM | POA: Diagnosis not present

## 2022-11-01 DIAGNOSIS — R5383 Other fatigue: Secondary | ICD-10-CM | POA: Diagnosis not present

## 2022-11-01 NOTE — Assessment & Plan Note (Addendum)
Continue healthy diet and regular exercise. Pap up to date.  Dental and vision up to date.

## 2022-11-01 NOTE — Progress Notes (Signed)
Subjective:   By signing my name below, I, Madelin Rear, attest that this documentation has been prepared under the direction and in the presence of Debbrah Alar, NP. 11/01/2022.   Patient ID: Kim Wheeler, female    DOB: [birth year], 32 y.o.   MRN: [id number]  Chief Complaint  Patient presents with   Annual Exam   HPI Patient is in today for a comprehensive physical exam.  Covid-19: Yesterday she tested positive for Covid-19. She had symptoms this past Saturday including a fever. Currently she endorses mild nasal congestion but no other symptoms. Of note, since November 2023 she endorses 2 other respiratory illnesses. In December her daughter may have had RSV contracted at her daycare. She does not wish to formally address the covid-19 today as she is improving.   Arthralgias:  She complains of hip and back pain which she attributes to having a very sedentary job.  Fatigue:  Since her daughter was born she has felt constantly fatigued despite adequate rest at night.  Dry Skin:  Since January she has noticed more dry skin patches, especially on her right arm. Sometimes associated with pruritus.  Headaches:  Only has headaches if she doesn't drink enough water.  Social history:  She denies any changes to her family medical history. She is on birth control pills. No alcohol. No drug use. No tobacco or vaping use. Currently she is working at [employer] and [employer]. She has 1 daughter born in 2021.  Pap Smear:  Last completed 04/28/2020. Follows with gynecology.  Immunizations:  Influenza vaccine last received 05/24/2022.  Covid-19 vaccine last received 06/15/2022.  Tdap last received 10/14/2016.  Diet:  Relatively stable.   Exercise:  She has been walking at work during her lunch now that the weather is nicer. She may walk up to 25   Dental/Vision:  She is UTD with her routine examinations.   Denies having any new moles, sinus pain, sore  throat, chest pain, palpitations, cough, SOB, wheezing, n/v/d, constipation, blood in stool, dysuria, frequency, hematuria, at this time.  Past Medical History:  Diagnosis Date   Conjunctivitis    Preeclampsia 2021   "mild"    Pregnancy induced hypertension 2021    Past Surgical History:  Procedure Laterality Date   EYE SURGERY Bilateral 1993   strabismus     Family History  Problem Relation Age of Onset   Cancer Mother        breast cancer   Hypertension Mother    Gout Father    Dementia Maternal Grandmother    Stroke Maternal Grandmother    Breast cancer Maternal Grandmother    Hypothyroidism Maternal Grandfather    CAD Paternal Grandfather        MI x 3    Social History   Socioeconomic History   Marital status: Married    Spouse name: Not on file   Number of children: Not on file   Years of education: Not on file   Highest education level: Not on file  Occupational History   Not on file  Tobacco Use   Smoking status: Never   Smokeless tobacco: Never  Substance and Sexual Activity   Alcohol use: Not Currently   Drug use: No   Sexual activity: Yes    Partners: Male    Birth control/protection: Pill, OCP  Other Topics Concern   Not on file  Social History Narrative   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  Married   Grew up in [place]   1 daughter- Jodi Mourning- born 7/22   No pets   Enjoys reading, outdoor activities      Social Determinants of Health   Financial Resource Strain: Not on file  Food Insecurity: Not on file  Transportation Needs: Not on file  Physical Activity: Not on file  Stress: Not on file  Social Connections: Not on file  Intimate Partner Violence: Not on file    Outpatient Medications Prior to Visit  Medication Sig Dispense Refill   norethindrone-ethinyl estradiol (MICROGESTIN) 1-20 MG-MCG tablet Take 1 tablet by mouth continously, active pills only 84 tablet 3   clobetasol ointment (TEMOVATE) 0.05 % APPLY A THIN LAYER  TO THE AFFECTED AREA(S) 2 TIMES A DAY 30 g 0   COVID-19 mRNA vaccine 2023-2024 (COMIRNATY) syringe Inject into the muscle. 0.3 mL 0   norethindrone (MICRONOR) 0.35 MG tablet Take 1 tablet (0.35 mg total) by mouth daily. 28 tablet 11   norethindrone (ORTHO MICRONOR) 0.35 MG tablet Take 1 tablet by mouth daily 84 tablet 0   No facility-administered medications prior to visit.    Allergies  Allergen Reactions   Drug [Tape] Rash   Latex Rash   Wound Dressing Adhesive Dermatitis, Itching and Rash    Review of Systems  Constitutional:  Positive for malaise/fatigue. Negative for fever.  HENT:  Positive for congestion. Negative for sinus pain and sore throat.   Respiratory:  Negative for cough, shortness of breath and wheezing.   Cardiovascular:  Negative for chest pain and palpitations.  Gastrointestinal:  Negative for blood in stool, constipation, diarrhea, nausea and vomiting.  Genitourinary:  Negative for dysuria, frequency and hematuria.  Musculoskeletal:  Positive for back pain and joint pain (Hips).  Skin:  Positive for itching (With dry skin patches).       Objective:    Physical Exam Constitutional:      Appearance: Normal appearance.  HENT:     Head: Normocephalic and atraumatic.     Right Ear: Tympanic membrane, ear canal and external ear normal.     Left Ear: Tympanic membrane, ear canal and external ear normal.  Eyes:     Extraocular Movements: Extraocular movements intact.     Pupils: Pupils are equal, round, and reactive to light.  Cardiovascular:     Rate and Rhythm: Normal rate and regular rhythm.     Heart sounds: Normal heart sounds. No murmur heard.    No gallop.  Pulmonary:     Effort: Pulmonary effort is normal. No respiratory distress.     Breath sounds: Normal breath sounds. No wheezing or rales.  Skin:    General: Skin is warm and dry.     Comments: Some dry eczema patches of bilateral upper arms.   Neurological:     General: No focal deficit  present.     Mental Status: She is alert and oriented to person, place, and time.     Deep Tendon Reflexes:     Reflex Scores:      Patellar reflexes are 2+ on the right side and 2+ on the left side. Psychiatric:        Mood and Affect: Mood normal.        Behavior: Behavior normal.     BP 120/82 (BP Location: Right Arm, Patient Position: Sitting, Cuff Size: Small)   Pulse 73   Temp (!) 97.2 F (36.2 C) (Temporal)   Resp 16   Ht '5\' 7"'$  (1.702 m)  Wt 134 lb (60.8 kg)   SpO2 100%   BMI 20.99 kg/m  Wt Readings from Last 3 Encounters:  11/01/22 134 lb (60.8 kg)  11/01/21 121 lb 3.2 oz (55 kg)  10/27/20 117 lb 12.8 oz (53.4 kg)       Assessment & Plan:   Problem List Items Addressed This Visit       Other   Preventative health care - Primary    Continue healthy diet and regular exercise. Pap up to date.  Dental and vision up to date.       Relevant Orders   Comp Met (CMET)   Lipid panel   Other Visit Diagnoses     Fatigue, unspecified type       Relevant Orders   CBC w/Diff   TSH   Vitamin D (25 hydroxy)        No orders of the defined types were placed in this encounter.   Alphonzo Grieve, personally preformed the services described in this documentation.  All medical record entries made by the scribe were at my direction and in my presence.  I have reviewed the chart and discharge instructions (if applicable) and agree that the record reflects my personal performance and is accurate and complete. 11/01/2022.  I,Mathew Stumpf,acting as a Education administrator for [employer], NP.,have documented all relevant documentation on the behalf of Nance Pear, NP,as directed by  Nance Pear, NP while in the presence of Nance Pear, NP.   Madelin Rear

## 2022-11-02 LAB — COMPREHENSIVE METABOLIC PANEL
ALT: 8 U/L (ref 0–35)
AST: 13 U/L (ref 0–37)
Albumin: 4.2 g/dL (ref 3.5–5.2)
Alkaline Phosphatase: 41 U/L (ref 39–117)
BUN: 8 mg/dL (ref 6–23)
CO2: 28 mEq/L (ref 19–32)
Calcium: 9.2 mg/dL (ref 8.4–10.5)
Chloride: 104 mEq/L (ref 96–112)
Creatinine, Ser: 0.66 mg/dL (ref 0.40–1.20)
GFR: 115.71 mL/min (ref >60.00)
Glucose, Bld: 86 mg/dL (ref 70–99)
Potassium: 3.8 mEq/L (ref 3.5–5.1)
Sodium: 140 mEq/L (ref 135–145)
Total Bilirubin: 0.4 mg/dL (ref 0.2–1.2)
Total Protein: 7.3 g/dL (ref 6.0–8.3)

## 2022-11-02 LAB — LIPID PANEL
Cholesterol: 145 mg/dL (ref 0–200)
HDL: 45.5 mg/dL (ref >39.00)
LDL Cholesterol: 69 mg/dL (ref 0–99)
NonHDL: 99.33
Total CHOL/HDL Ratio: 3
Triglycerides: 152 mg/dL — ABNORMAL HIGH (ref 0.0–149.0)
VLDL: 30.4 mg/dL (ref 0.0–40.0)

## 2022-11-02 LAB — CBC WITH DIFFERENTIAL/PLATELET
Basophils Absolute: 0 10*3/uL (ref 0.0–0.1)
Basophils Relative: 0.8 % (ref 0.0–3.0)
Eosinophils Absolute: 0.2 10*3/uL (ref 0.0–0.7)
Eosinophils Relative: 3.2 % (ref 0.0–5.0)
HCT: 37 % (ref 36.0–46.0)
Hemoglobin: 12.8 g/dL (ref 12.0–15.0)
Lymphocytes Relative: 28.8 % (ref 12.0–46.0)
Lymphs Abs: 1.8 10*3/uL (ref 0.7–4.0)
MCHC: 34.7 g/dL (ref 30.0–36.0)
MCV: 92.5 fl (ref 78.0–100.0)
Monocytes Absolute: 0.7 10*3/uL (ref 0.1–1.0)
Monocytes Relative: 11.8 % (ref 3.0–12.0)
Neutro Abs: 3.4 10*3/uL (ref 1.4–7.7)
Neutrophils Relative %: 55.4 % (ref 43.0–77.0)
Platelets: 318 10*3/uL (ref 150.0–400.0)
RBC: 4 Mil/uL (ref 3.87–5.11)
RDW: 12.1 % (ref 11.5–15.5)
WBC: 6.2 10*3/uL (ref 4.0–10.5)

## 2022-11-02 LAB — VITAMIN D 25 HYDROXY (VIT D DEFICIENCY, FRACTURES): VITD: 18.83 ng/mL — ABNORMAL LOW (ref 30.00–100.00)

## 2022-11-02 LAB — TSH: TSH: 3.5 u[IU]/mL (ref 0.35–5.50)

## 2022-11-03 ENCOUNTER — Other Ambulatory Visit: Payer: Self-pay | Admitting: Family

## 2022-11-03 ENCOUNTER — Other Ambulatory Visit (HOSPITAL_BASED_OUTPATIENT_CLINIC_OR_DEPARTMENT_OTHER): Payer: Self-pay

## 2022-11-03 ENCOUNTER — Encounter: Payer: Self-pay | Admitting: Family

## 2022-11-03 DIAGNOSIS — E559 Vitamin D deficiency, unspecified: Secondary | ICD-10-CM

## 2022-11-03 MED ORDER — VITAMIN D (ERGOCALCIFEROL) 1.25 MG (50000 UNIT) PO CAPS
50000.0000 [IU] | ORAL_CAPSULE | ORAL | 0 refills | Status: DC
  Filled 2022-11-03: qty 12, 84d supply, fill #0

## 2022-11-04 ENCOUNTER — Encounter: Payer: 59 | Admitting: Family

## 2022-11-21 ENCOUNTER — Other Ambulatory Visit (HOSPITAL_COMMUNITY): Payer: Self-pay

## 2022-11-22 ENCOUNTER — Other Ambulatory Visit (HOSPITAL_COMMUNITY): Payer: Self-pay

## 2022-11-22 MED ORDER — NORETHINDRONE ACET-ETHINYL EST 1-20 MG-MCG PO TABS
ORAL_TABLET | ORAL | 0 refills | Status: DC
  Filled 2022-11-22: qty 84, 84d supply, fill #0

## 2023-01-18 ENCOUNTER — Other Ambulatory Visit (HOSPITAL_COMMUNITY): Payer: Self-pay

## 2023-01-18 DIAGNOSIS — Z124 Encounter for screening for malignant neoplasm of cervix: Secondary | ICD-10-CM | POA: Diagnosis not present

## 2023-01-18 DIAGNOSIS — Z01419 Encounter for gynecological examination (general) (routine) without abnormal findings: Secondary | ICD-10-CM | POA: Diagnosis not present

## 2023-01-18 DIAGNOSIS — Z01411 Encounter for gynecological examination (general) (routine) with abnormal findings: Secondary | ICD-10-CM | POA: Diagnosis not present

## 2023-01-18 DIAGNOSIS — Z113 Encounter for screening for infections with a predominantly sexual mode of transmission: Secondary | ICD-10-CM | POA: Diagnosis not present

## 2023-01-18 DIAGNOSIS — Z0142 Encounter for cervical smear to confirm findings of recent normal smear following initial abnormal smear: Secondary | ICD-10-CM | POA: Diagnosis not present

## 2023-01-18 LAB — HM PAP SMEAR
HM Pap smear: NEGATIVE
HPV, high-risk: NEGATIVE

## 2023-01-18 MED ORDER — NORETHINDRONE ACET-ETHINYL EST 1-20 MG-MCG PO TABS
ORAL_TABLET | ORAL | 3 refills | Status: DC
  Filled 2023-01-18 – 2023-02-15 (×2): qty 84, 84d supply, fill #0
  Filled 2023-05-08: qty 84, 84d supply, fill #1
  Filled 2023-07-31: qty 84, 84d supply, fill #2
  Filled 2023-10-24: qty 84, 84d supply, fill #3

## 2023-01-30 ENCOUNTER — Other Ambulatory Visit (INDEPENDENT_AMBULATORY_CARE_PROVIDER_SITE_OTHER): Payer: 59

## 2023-01-30 DIAGNOSIS — E559 Vitamin D deficiency, unspecified: Secondary | ICD-10-CM

## 2023-01-30 LAB — VITAMIN D 25 HYDROXY (VIT D DEFICIENCY, FRACTURES): VITD: 55.81 ng/mL (ref 30.00–100.00)

## 2023-02-15 ENCOUNTER — Other Ambulatory Visit (HOSPITAL_COMMUNITY): Payer: Self-pay

## 2023-02-15 ENCOUNTER — Other Ambulatory Visit: Payer: Self-pay

## 2023-04-25 DIAGNOSIS — H5211 Myopia, right eye: Secondary | ICD-10-CM | POA: Diagnosis not present

## 2023-05-09 ENCOUNTER — Other Ambulatory Visit (HOSPITAL_COMMUNITY): Payer: Self-pay

## 2023-07-26 ENCOUNTER — Telehealth: Payer: 59 | Admitting: Physician Assistant

## 2023-07-26 ENCOUNTER — Other Ambulatory Visit (HOSPITAL_COMMUNITY): Payer: Self-pay

## 2023-07-26 DIAGNOSIS — B9689 Other specified bacterial agents as the cause of diseases classified elsewhere: Secondary | ICD-10-CM

## 2023-07-26 DIAGNOSIS — J019 Acute sinusitis, unspecified: Secondary | ICD-10-CM | POA: Diagnosis not present

## 2023-07-26 MED ORDER — AMOXICILLIN-POT CLAVULANATE 875-125 MG PO TABS
1.0000 | ORAL_TABLET | Freq: Two times a day (BID) | ORAL | 0 refills | Status: DC
  Filled 2023-07-26: qty 14, 7d supply, fill #0

## 2023-07-26 NOTE — Progress Notes (Signed)

## 2023-07-26 NOTE — Progress Notes (Signed)
I have spent 5 minutes in review of e-visit questionnaire, review and updating patient chart, medical decision making and response to patient.   Mia Milan Cody Jacklynn Dehaas, PA-C    

## 2023-08-18 ENCOUNTER — Telehealth: Payer: 59 | Admitting: Physician Assistant

## 2023-08-18 ENCOUNTER — Other Ambulatory Visit (HOSPITAL_BASED_OUTPATIENT_CLINIC_OR_DEPARTMENT_OTHER): Payer: Self-pay

## 2023-08-18 DIAGNOSIS — H109 Unspecified conjunctivitis: Secondary | ICD-10-CM

## 2023-08-18 MED ORDER — OFLOXACIN 0.3 % OP SOLN
1.0000 [drp] | Freq: Four times a day (QID) | OPHTHALMIC | 0 refills | Status: AC
  Filled 2023-08-18: qty 5, 25d supply, fill #0

## 2023-08-18 NOTE — Progress Notes (Signed)
E-Visit for [employer]   We are sorry that you are not feeling well.  Here is how we plan to help!  Based on what you have shared with me it looks like you have conjunctivitis.  Conjunctivitis is a common inflammatory or infectious condition of the eye that is often referred to as "pink eye".  In most cases it is contagious (viral or bacterial). However, not all conjunctivitis requires antibiotics (ex. Allergic).  We have made appropriate suggestions for you based upon your presentation.  I have prescribed Oflaxacin 1-2 drops 4 times a day times 5 days   Pink eye can be highly contagious.  It is typically spread through direct contact with secretions, or contaminated objects or surfaces that one may have touched.  Strict handwashing is suggested with soap and water is urged.  If not available, use alcohol based had sanitizer.  Avoid unnecessary touching of the eye.  If you wear contact lenses, you will need to refrain from wearing them until you see no white discharge from the eye for at least 24 hours after being on medication.  You should see symptom improvement in 1-2 days after starting the medication regimen.  Call us if symptoms are not improved in 1-2 days.  Home Care: Wash your hands often! Do not wear your contacts until you complete your treatment plan. Avoid sharing towels, bed linen, personal items with a person who has pink eye. See attention for anyone in your home with similar symptoms.  Get Help Right Away If: Your symptoms do not improve. You develop blurred or loss of vision. Your symptoms worsen (increased discharge, pain or redness)   Thank you for choosing an e-visit.  Your e-visit answers were reviewed by a board certified advanced clinical practitioner to complete your personal care plan. Depending upon the condition, your plan could have included both over the counter or prescription medications.  Please review your pharmacy choice. Make sure the pharmacy is open so  you can pick up prescription now. If there is a problem, you may contact your provider through [employer] and have the prescription routed to another pharmacy.  Your safety is important to [place]. If you have drug allergies check your prescription carefully.   For the next 24 hours you can use MyChart to ask questions about today's visit, request a non-urgent call back, or ask for a work or school excuse. You will get an email in the next two days asking about your experience. I hope that your e-visit has been valuable and will speed your recovery.   I have spent 5 minutes in review of e-visit questionnaire, review and updating patient chart, medical decision making and response to patient.   Mar Daring, PA-C

## 2023-11-03 ENCOUNTER — Encounter: Payer: 59 | Admitting: Family

## 2023-11-06 ENCOUNTER — Ambulatory Visit (INDEPENDENT_AMBULATORY_CARE_PROVIDER_SITE_OTHER): Payer: 59 | Admitting: Family

## 2023-11-06 ENCOUNTER — Encounter: Payer: Self-pay | Admitting: Family

## 2023-11-06 ENCOUNTER — Other Ambulatory Visit (HOSPITAL_COMMUNITY): Payer: Self-pay

## 2023-11-06 VITALS — BP 118/76 | HR 74 | Temp 98.9°F | Resp 16 | Ht 67.0 in | Wt 135.0 lb

## 2023-11-06 DIAGNOSIS — K219 Gastro-esophageal reflux disease without esophagitis: Secondary | ICD-10-CM | POA: Diagnosis not present

## 2023-11-06 DIAGNOSIS — E781 Pure hyperglyceridemia: Secondary | ICD-10-CM

## 2023-11-06 DIAGNOSIS — Z0001 Encounter for general adult medical examination with abnormal findings: Secondary | ICD-10-CM

## 2023-11-06 DIAGNOSIS — Z889 Allergy status to unspecified drugs, medicaments and biological substances status: Secondary | ICD-10-CM | POA: Diagnosis not present

## 2023-11-06 DIAGNOSIS — R110 Nausea: Secondary | ICD-10-CM | POA: Diagnosis not present

## 2023-11-06 DIAGNOSIS — Z0000 Encounter for general adult medical examination without abnormal findings: Secondary | ICD-10-CM

## 2023-11-06 LAB — COMPREHENSIVE METABOLIC PANEL
ALT: 9 U/L (ref 0–35)
AST: 12 U/L (ref 0–37)
Albumin: 4.7 g/dL (ref 3.5–5.2)
Alkaline Phosphatase: 36 U/L — ABNORMAL LOW (ref 39–117)
BUN: 8 mg/dL (ref 6–23)
CO2: 27 meq/L (ref 19–32)
Calcium: 9.4 mg/dL (ref 8.4–10.5)
Chloride: 105 meq/L (ref 96–112)
Creatinine, Ser: 0.56 mg/dL (ref 0.40–1.20)
GFR: 119.53 mL/min (ref >60.00)
Glucose, Bld: 87 mg/dL (ref 70–99)
Potassium: 4 meq/L (ref 3.5–5.1)
Sodium: 138 meq/L (ref 135–145)
Total Bilirubin: 1.2 mg/dL (ref 0.2–1.2)
Total Protein: 7.5 g/dL (ref 6.0–8.3)

## 2023-11-06 LAB — LIPID PANEL
Cholesterol: 160 mg/dL (ref 0–200)
HDL: 51.1 mg/dL (ref >39.00)
LDL Cholesterol: 85 mg/dL (ref 0–99)
NonHDL: 109.26
Total CHOL/HDL Ratio: 3
Triglycerides: 120 mg/dL (ref 0.0–149.0)
VLDL: 24 mg/dL (ref 0.0–40.0)

## 2023-11-06 MED ORDER — OMEPRAZOLE 40 MG PO CPDR
40.0000 mg | DELAYED_RELEASE_CAPSULE | Freq: Every day | ORAL | 0 refills | Status: DC
  Filled 2023-11-06: qty 30, 30d supply, fill #0

## 2023-11-06 NOTE — Assessment & Plan Note (Addendum)
  Up to date on exams and screenings. Discussed hepatitis C screening- pt declines. Noted slightly elevated triglycerides last year on lipid panel.  - Order hepatitis C screening. - Order metabolic panel and cholesterol check. - Request copy of pap from her gyn.  - encouraged pt to increase her cardiovascular exercise slowly to build up stamina.

## 2023-11-06 NOTE — Assessment & Plan Note (Signed)
 Added augmentin to her allergy list.

## 2023-11-06 NOTE — Progress Notes (Signed)
 Subjective:     Patient ID: Kim Wheeler, female    DOB: 09-30-1990, 33 y.o.   MRN: [id number]  Chief Complaint  Patient presents with   Annual Exam    HPI  Discussed the use of AI scribe software for clinical note transcription with the patient, who gave verbal consent to proceed.  History of Present Illness  Kim Wheeler is a 33 year old female who presents for an annual physical exam.  She experiences significant exertional dyspnea and tachycardia when climbing stairs, with her heart rate reaching 160 bpm. These symptoms do not occur when walking on flat surfaces or uphill. She describes feeling 'like I'm eighty pounds more than I am' when climbing stairs.  She has persistent nausea and heartburn, which began during her pregnancy. Citrus and chocolate trigger her heartburn, and she uses Pepcid with variable effectiveness. The nausea occurs regardless of food intake and can last for several days in a week, though she never vomits. No abdominal pain, constipation, or diarrhea.  She experiences right knee pain, particularly when climbing stairs or stretching in bed, describing it as stiffness rather than constant pain. She does not take any medication for it.  She developed a rash after taking Augmentin which was itchy and spread over her body, resolving after a few days with Zyrtec. She brings with her some photos of the rash which appeared diffuse and petechial. She also mentions a new mole that appeared recently.  She has anxiety related to vomiting, feeling anxious when her husband or daughter show signs of potential illness, though this is not a frequent issue.     Health Maintenance Due  Topic Date Due   COVID-19 Vaccine (6 - 2024-25 season) 04/23/2023   Cervical Cancer Screening (HPV/Pap Cotest)  04/29/2023    Past Medical History:  Diagnosis Date   Conjunctivitis    Preeclampsia 2021   "mild"    Pregnancy induced hypertension 2021   SVD (spontaneous vaginal  delivery) 7/25 03/15/2020    Past Surgical History:  Procedure Laterality Date   EYE SURGERY Bilateral 1993   strabismus     Family History  Problem Relation Age of Onset   Cancer Mother        breast cancer   Hypertension Mother    Gout Father    Dementia Maternal Grandmother    Stroke Maternal Grandmother    Breast cancer Maternal Grandmother    Hypothyroidism Maternal Grandfather    CAD Paternal Grandfather        MI x 3    Social History   Socioeconomic History   Marital status: Married    Spouse name: Not on file   Number of children: Not on file   Years of education: Not on file   Highest education level: Master's degree (e.g., MA, MS, MEng, MEd, MSW, MBA)  Occupational History   Not on file  Tobacco Use   Smoking status: Never   Smokeless tobacco: Never  Substance and Sexual Activity   Alcohol use: Not Currently   Drug use: No   Sexual activity: Yes    Partners: Male    Birth control/protection: Pill, OCP  Other Topics Concern   Not on file  Social History Narrative   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   Grew up in [place]   1 daughter- Kim Wheeler- born 7/22   No pets   Enjoys reading, outdoor activities      Social Drivers of Health  Financial Resource Strain: Low Risk  (10/30/2023)   Overall Financial Resource Strain (CARDIA)    Difficulty of Paying Living Expenses: Not hard at all  Food Insecurity: No Food Insecurity (10/30/2023)   Hunger Vital Sign    Worried About Running Out of Food in the Last Year: Never true    Ran Out of Food in the Last Year: Never true  Transportation Needs: No Transportation Needs (10/30/2023)   PRAPARE - Administrator, Civil Service (Medical): No    Lack of Transportation (Non-Medical): No  Physical Activity: Insufficiently Active (10/30/2023)   Exercise Vital Sign    Days of Exercise per Week: 4 days    Minutes of Exercise per Session: 20 min  Stress: Stress Concern Present  (10/30/2023)   [employer] of Occupational Health - Occupational Stress Questionnaire    Feeling of Stress : To some extent  Social Connections: Moderately Isolated (10/30/2023)   Social Connection and Isolation Panel [NHANES]    Frequency of Communication with Friends and Family: More than three times a week    Frequency of Social Gatherings with Friends and Family: Twice a week    Attends Religious Services: Never    Database administrator or Organizations: No    Attends Engineer, structural: Not on file    Marital Status: Married  Catering manager Violence: Not on file    Outpatient Medications Prior to Visit  Medication Sig Dispense Refill   norethindrone-ethinyl estradiol (LOESTRIN) 1-20 MG-MCG tablet Take 1 tablet by mouth continuously, active pills only 84 tablet 3   No facility-administered medications prior to visit.    Allergies  Allergen Reactions   Augmentin [Amoxicillin-Pot Clavulanate]     rash   Drug [Tape] Rash   Latex Rash   Wound Dressing Adhesive Dermatitis, Itching and Rash    Review of Systems  Constitutional:  Negative for weight loss.  HENT:  Negative for congestion and hearing loss.   Eyes:  Negative for blurred vision.  Respiratory:  Negative for cough.   Cardiovascular:  Negative for leg swelling.  Gastrointestinal:  Positive for nausea. Negative for constipation and diarrhea.  Genitourinary:  Negative for dysuria and frequency.  Musculoskeletal:  Positive for joint pain (some right knee pain). Negative for myalgias.  Skin:  Negative for rash.  Neurological:  Negative for headaches.  Psychiatric/Behavioral:         Denies depression, some anxiety about vomiting (self/others)       Objective:    Physical Exam   BP 118/76 (BP Location: Right Arm, Patient Position: Sitting, Cuff Size: Small)   Pulse 74   Temp 98.9 F (37.2 C) (Oral)   Resp 16   Ht 5\' 7"  (1.702 m)   Wt 135 lb (61.2 kg)   SpO2 100%   BMI 21.14 kg/m  Wt  Readings from Last 3 Encounters:  11/06/23 135 lb (61.2 kg)  11/01/22 134 lb (60.8 kg)  11/01/21 121 lb 3.2 oz (55 kg)  Physical Exam  Constitutional: She is oriented to person, place, and time. She appears well-developed and well-nourished. No distress.  HENT:  Head: Normocephalic and atraumatic.  Right Ear: Tympanic membrane and ear canal normal.  Left Ear: Tympanic membrane and ear canal normal.  Mouth/Throat: Oropharynx is clear and moist.  Eyes: Pupils are equal, round, and reactive to light. No scleral icterus.  Neck: Normal range of motion. No thyromegaly present.  Cardiovascular: Normal rate and regular rhythm.   No murmur heard. Pulmonary/Chest:  Effort normal and breath sounds normal. No respiratory distress. He has no wheezes. She has no rales. She exhibits no tenderness.  Abdominal: Soft. Bowel sounds are normal. She exhibits no distension and no mass. There is no tenderness. There is no rebound and no guarding.  Musculoskeletal: She exhibits no edema.  Lymphadenopathy:    She has no cervical adenopathy.  Neurological: She is alert and oriented to person, place, and time. She has normal patellar reflexes. She exhibits normal muscle tone. Coordination normal.  Skin: Skin is warm and dry. Small new freckle noted right upper anterior chest near axilla Psychiatric: She has a normal mood and affect. Her behavior is normal. Judgment and thought content normal.              Assessment & Plan:        Assessment & Plan:   Problem List Items Addressed This Visit       Unprioritized   Preventative health care - Primary    Up to date on exams and screenings. Discussed hepatitis C screening- pt declines. Noted slightly elevated triglycerides last year on lipid panel.  - Order hepatitis C screening. - Order metabolic panel and cholesterol check. - Request copy of pap from her gyn.  - encouraged pt to increase her cardiovascular exercise slowly to build up stamina.        Gastroesophageal reflux disease    Persistent nausea and heartburn likely due to chronic gastritis. H. pylori testing suggested. Omeprazole prescribed. GI referral if symptoms persist. H. pylori positive will require antibiotics and antacid. - Order H. pylori test. - Prescribe omeprazole. - Advise to report if symptoms persist for potential GI referral.      Relevant Medications   omeprazole (PRILOSEC) 40 MG capsule   Drug allergy   Added augmentin to her allergy list.       Other Visit Diagnoses       Hypertriglyceridemia       Relevant Orders   Comp Met (CMET)   Lipid panel     Nausea       Relevant Orders   H. pylori breath test       I am having Yolonda Kida. Kaigler start on omeprazole. I am also having her maintain her norethindrone-ethinyl estradiol.  Meds ordered this encounter  Medications   omeprazole (PRILOSEC) 40 MG capsule    Sig: Take 1 capsule (40 mg total) by mouth daily.    Dispense:  30 capsule    Refill:  0    Supervising Provider:   Danise Edge A [[id number]]

## 2023-11-06 NOTE — Patient Instructions (Signed)
 VISIT SUMMARY:  Today, we conducted your annual physical exam and discussed several health concerns you have been experiencing, including nausea, heartburn, exercise intolerance, right knee pain, and a recent allergic reaction. We also reviewed your general health maintenance and planned some follow-up actions.  YOUR PLAN:  -CHRONIC NAUSEA AND HEARTBURN: Your persistent nausea and heartburn are likely due to chronic inflammation of the stomach lining. We have ordered a test for H. pylori, a bacteria that can cause these symptoms, and prescribed omeprazole to reduce stomach acid. If your symptoms do not improve, we may refer you to a gastrointestinal specialist.  -EXERCISE INTOLERANCE: The shortness of breath and rapid heart rate you experience when climbing stairs are likely due to the increased effort required. We recommend starting with gentle cardio exercises to build your endurance. Please let us know if you find these exercises difficult to tolerate.  -RIGHT KNEE PAIN: The pain in your right knee, especially when climbing stairs, may be due to strain or overuse. If the pain continues, we may refer you to a sports medicine specialist for further evaluation.  -AUGMENTIN ALLERGY: You had an allergic reaction to Augmentin, which caused a rash that resolved with Zyrtec. We have added Augmentin to your allergy list and will consider alternative antibiotics in the future.  -GENERAL HEALTH MAINTENANCE: You are up to date on your exams and screenings. We discussed screening for hepatitis C and noted that your triglycerides are slightly elevated. We have ordered a hepatitis C screening and a metabolic panel to check your cholesterol levels.  INSTRUCTIONS:  Please send [place] a note in three weeks to update [place] on your stomach symptoms. Schedule a follow-up appointment in one year if your symptoms resolve.

## 2023-11-06 NOTE — Assessment & Plan Note (Signed)
  Persistent nausea and heartburn likely due to chronic gastritis. H. pylori testing suggested. Omeprazole prescribed. GI referral if symptoms persist. H. pylori positive will require antibiotics and antacid. - Order H. pylori test. - Prescribe omeprazole. - Advise to report if symptoms persist for potential GI referral.

## 2023-11-07 ENCOUNTER — Other Ambulatory Visit (HOSPITAL_BASED_OUTPATIENT_CLINIC_OR_DEPARTMENT_OTHER): Payer: Self-pay

## 2023-11-07 ENCOUNTER — Other Ambulatory Visit (HOSPITAL_COMMUNITY): Payer: Self-pay

## 2023-11-07 ENCOUNTER — Other Ambulatory Visit: Payer: Self-pay

## 2023-11-07 ENCOUNTER — Telehealth: Payer: Self-pay | Admitting: Family

## 2023-11-07 DIAGNOSIS — K219 Gastro-esophageal reflux disease without esophagitis: Secondary | ICD-10-CM

## 2023-11-07 DIAGNOSIS — A048 Other specified bacterial intestinal infections: Secondary | ICD-10-CM | POA: Insufficient documentation

## 2023-11-07 LAB — H. PYLORI BREATH TEST: H. pylori Breath Test: DETECTED — AB

## 2023-11-07 MED ORDER — OMEPRAZOLE 40 MG PO CPDR
40.0000 mg | DELAYED_RELEASE_CAPSULE | Freq: Two times a day (BID) | ORAL | 0 refills | Status: DC
  Filled 2023-11-07 (×2): qty 28, 14d supply, fill #0

## 2023-11-07 MED ORDER — METRONIDAZOLE 500 MG PO TABS
500.0000 mg | ORAL_TABLET | Freq: Four times a day (QID) | ORAL | 0 refills | Status: DC
  Filled 2023-11-07 (×2): qty 56, 14d supply, fill #0

## 2023-11-07 MED ORDER — TETRACYCLINE HCL 500 MG PO CAPS
500.0000 mg | ORAL_CAPSULE | Freq: Four times a day (QID) | ORAL | 0 refills | Status: DC
  Filled 2023-11-07 (×2): qty 56, 14d supply, fill #0

## 2023-11-07 MED ORDER — PEPTO-BISMOL MAX STRENGTH 525 MG/15ML PO SUSP
8.5000 mL | Freq: Four times a day (QID) | ORAL | 0 refills | Status: AC
Start: 2023-11-07 — End: 2023-11-21
  Filled 2023-11-07 (×2): qty 474, 14d supply, fill #0

## 2023-11-07 NOTE — Telephone Encounter (Signed)
 See mychart.

## 2023-11-08 ENCOUNTER — Other Ambulatory Visit (HOSPITAL_COMMUNITY): Payer: Self-pay

## 2023-11-10 ENCOUNTER — Other Ambulatory Visit (HOSPITAL_BASED_OUTPATIENT_CLINIC_OR_DEPARTMENT_OTHER): Payer: Self-pay

## 2023-11-10 MED ORDER — ONDANSETRON HCL 4 MG PO TABS
4.0000 mg | ORAL_TABLET | Freq: Three times a day (TID) | ORAL | 0 refills | Status: AC | PRN
  Filled 2023-11-10: qty 30, 10d supply, fill #0

## 2023-11-10 NOTE — Addendum Note (Signed)
 Addended by: Sandford Craze on: 11/10/2023 10:52 AM   Modules accepted: Orders

## 2023-11-17 ENCOUNTER — Ambulatory Visit: Admitting: Physician Assistant

## 2023-11-17 ENCOUNTER — Ambulatory Visit: Payer: Self-pay

## 2023-11-17 ENCOUNTER — Other Ambulatory Visit (HOSPITAL_BASED_OUTPATIENT_CLINIC_OR_DEPARTMENT_OTHER): Payer: Self-pay

## 2023-11-17 ENCOUNTER — Encounter: Payer: Self-pay | Admitting: Physician Assistant

## 2023-11-17 VITALS — BP 115/82 | HR 98 | Ht 67.0 in | Wt 132.0 lb

## 2023-11-17 DIAGNOSIS — A048 Other specified bacterial intestinal infections: Secondary | ICD-10-CM

## 2023-11-17 DIAGNOSIS — T7840XA Allergy, unspecified, initial encounter: Secondary | ICD-10-CM | POA: Diagnosis not present

## 2023-11-17 MED ORDER — PREDNISONE 20 MG PO TABS
20.0000 mg | ORAL_TABLET | Freq: Every day | ORAL | 0 refills | Status: DC
  Filled 2023-11-17: qty 5, 5d supply, fill #0

## 2023-11-17 NOTE — Progress Notes (Signed)
 Established patient visit   Patient: Kim Wheeler   DOB: September 15, 1990   33 y.o. Female  MRN: [id number] Visit Date: 11/17/2023  Today's healthcare provider: Alfredia Ferguson, PA-C   Cc. allergy  Subjective     Pt started hypylori treatment, and yesterday starting having a headache and full body rash. Slight temp, 29F. She reports a similar itchy, fully body rash with augmentin.   Medications: Outpatient Medications Prior to Visit  Medication Sig   Bismuth Subsalicylate (PEPTO-BISMOL MAX STRENGTH) 525 MG/15ML SUSP Take 8.5 mLs (297.5 mg total) by mouth in the morning, at noon, in the evening, and at bedtime for 14 days.   norethindrone-ethinyl estradiol (LOESTRIN) 1-20 MG-MCG tablet Take 1 tablet by mouth continuously, active pills only   omeprazole (PRILOSEC) 40 MG capsule Take 1 capsule (40 mg total) by mouth in the morning and at bedtime for 14 days.   ondansetron (ZOFRAN) 4 MG tablet Take 1 tablet (4 mg total) by mouth every 8 (eight) hours as needed for nausea or vomiting.   [DISCONTINUED] metroNIDAZOLE (FLAGYL) 500 MG tablet Take 1 tablet (500 mg total) by mouth 4 (four) times daily for 14 days.   [DISCONTINUED] tetracycline (SUMYCIN) 500 MG capsule Take 1 capsule (500 mg total) by mouth 4 (four) times daily for 14 days.   No facility-administered medications prior to visit.    Review of Systems  Constitutional:  Negative for fatigue and fever.  Respiratory:  Negative for cough and shortness of breath.   Cardiovascular:  Negative for chest pain and leg swelling.  Gastrointestinal:  Negative for abdominal pain.  Skin:  Positive for rash.  Neurological:  Negative for dizziness and headaches.       Objective    BP 115/82   Pulse 98   Ht 5\' 7"  (1.702 m)   Wt 132 lb (59.9 kg)   SpO2 97%   BMI 20.67 kg/m    Physical Exam Constitutional:      General: She is awake.     Appearance: She is well-developed.  HENT:     Head: Normocephalic.  Eyes:      Conjunctiva/sclera: Conjunctivae normal.  Cardiovascular:     Rate and Rhythm: Normal rate and regular rhythm.     Heart sounds: Normal heart sounds.  Pulmonary:     Effort: Pulmonary effort is normal.     Breath sounds: Normal breath sounds.  Skin:    General: Skin is warm.     Comments: Full body fine slightly textured erythematous macular rash  Neurological:     Mental Status: She is alert and oriented to person, place, and time.  Psychiatric:        Attention and Perception: Attention normal.        Mood and Affect: Mood normal.        Speech: Speech normal.        Behavior: Behavior is cooperative.     No results found for any visits on 11/17/23.  Assessment & Plan    Allergic reaction, initial encounter D/c both metronidazole and tetracycline. Rx prednisone x 5 days  Recommend zyrtec BID  Encourage fluids -     predniSONE; Take 1 tablet (20 mg total) by mouth daily with breakfast.  Dispense: 5 tablet; Refill: 0  Bacterial infection due to H. pylori Given inability to finish tx referring to GI -     Ambulatory referral to Gastroenterology    Return if symptoms worsen or fail to improve.  Alfredia Ferguson, PA-C  [facility] [phone] (phone) [phone] (fax)  [facility]

## 2023-11-17 NOTE — Telephone Encounter (Signed)
  Chief Complaint: Widespread rash Symptoms: red raised skin Frequency: onset 11/16/23 evening Pertinent Negatives: Patient denies difficulty breathing, difficulty swallowing Disposition: [] ED /[] Urgent Care (no appt availability in office) / [x] Appointment(In office/virtual)/ []  [facility]/ [] Home Care/ [] Refused Recommended Disposition /[] [facility]/ []  Follow-up with PCP Additional Notes:  Being treated for H Pylori, started abx flagyl and tetracycline on 11/07/23.  Sunday developed a headache that is a dull ache, however overnight the pain was severe. This morning noted temp 99.5. Widespread raised red rash developed last night at 830pm. She had a rash previously to augmentin. Denies breathing difficulty or oral and facial swelling. Acute office visit scheduled today with an alternate provider. Educated on care advice as documented in protocol, patient verbalized understanding.    Copied from CRM [id number]. Topic: Clinical - Medical Advice >> Nov 17, 2023 11:27 AM Tiffany S wrote: Reason for CRM: Patient is currently on antibiotic and has developed  rash and now having headaches patient is asking to speak with Nurse because she doesn't want to continue taking the antibiotics Reason for Disposition  Taking new prescription antibiotic  (Exception: Finished taking new prescription antibiotic.)  Protocols used: Rash - Widespread On Drugs-A-AH

## 2023-11-17 NOTE — Telephone Encounter (Signed)
 Patient here to see Alfredia Ferguson PA

## 2023-11-29 ENCOUNTER — Encounter: Payer: Self-pay | Admitting: Gastroenterology

## 2024-01-19 ENCOUNTER — Other Ambulatory Visit (HOSPITAL_COMMUNITY): Payer: Self-pay

## 2024-01-19 DIAGNOSIS — Z01419 Encounter for gynecological examination (general) (routine) without abnormal findings: Secondary | ICD-10-CM | POA: Diagnosis not present

## 2024-01-19 DIAGNOSIS — Z1331 Encounter for screening for depression: Secondary | ICD-10-CM | POA: Diagnosis not present

## 2024-01-19 MED ORDER — NORETHINDRONE ACET-ETHINYL EST 1-20 MG-MCG PO TABS
1.0000 | ORAL_TABLET | Freq: Every day | ORAL | 3 refills | Status: AC
  Filled 2024-01-19: qty 84, 63d supply, fill #0
  Filled 2024-03-21: qty 84, 63d supply, fill #1
  Filled 2024-05-29: qty 84, 63d supply, fill #2

## 2024-01-29 ENCOUNTER — Encounter: Payer: Self-pay | Admitting: Gastroenterology

## 2024-01-29 ENCOUNTER — Ambulatory Visit (INDEPENDENT_AMBULATORY_CARE_PROVIDER_SITE_OTHER): Admitting: Gastroenterology

## 2024-01-29 VITALS — BP 122/68 | HR 83 | Ht 67.0 in | Wt 138.0 lb

## 2024-01-29 DIAGNOSIS — Z889 Allergy status to unspecified drugs, medicaments and biological substances status: Secondary | ICD-10-CM

## 2024-01-29 DIAGNOSIS — B9681 Helicobacter pylori [H. pylori] as the cause of diseases classified elsewhere: Secondary | ICD-10-CM | POA: Diagnosis not present

## 2024-01-29 DIAGNOSIS — A048 Other specified bacterial intestinal infections: Secondary | ICD-10-CM

## 2024-01-29 NOTE — Progress Notes (Signed)
 Discussed the use of AI scribe software for clinical note transcription with the patient, who gave verbal consent to proceed.  HPI : Kim Wheeler is a 33 year old female with H. pylori infection who presents with medication allergies complicating treatment.  She had been experiencing persistent nausea that is 'off and on, mostly on,' regardless of food intake or time of day. No vomiting, but the nausea was described as 'nagging.'   She was diagnosed with H. pylori after testing was ordered due to her symptoms. She was started on a treatment regimen including Pepto-Bismol, omeprazole , tetracycline , and metronidazole . On the eighth day of treatment, she developed a full-body rash, similar to a previous reaction to Augmentin  taken in December for a sinus infection. The rash was itchy and accompanied by a severe headache and a low grade fever. The rash resolved in two to three days after a course of prednisone .  Her nausea has improved since starting treatment, and her stool consistency has become more formed, which she attributes to the antibiotics. No current gastrointestinal issues, heartburn, or acid regurgitation, although she previously experienced acid symptoms with citrus foods.  She has a history of a similar rash with Augmentin , which appeared 24 to 48 hours after completing a seven-day course. She had no new exposures to soaps or perfumes at that time. She has never taken metronidazole  or tetracycline  before this treatment. As a child, she took amoxicillin  without any adverse reactions.  She works in Scientist, product/process development at [employer].      Past Medical History:  Diagnosis Date   Conjunctivitis    GERD (gastroesophageal reflux disease)    Preeclampsia 2021   "mild"    Pregnancy induced hypertension 2021   SVD (spontaneous vaginal delivery) 7/25 03/15/2020     Past Surgical History:  Procedure Laterality Date   EYE SURGERY Bilateral 1993   strabismus    Family History  Problem  Relation Age of Onset   Cancer Mother        breast cancer   Hypertension Mother    Gout Father    Dementia Maternal Grandmother    Stroke Maternal Grandmother    Breast cancer Maternal Grandmother    Hypothyroidism Maternal Grandfather    CAD Paternal Grandfather        MI x 3   Liver disease Neg Hx    Colon cancer Neg Hx    Esophageal cancer Neg Hx    Social History   Tobacco Use   Smoking status: Never   Smokeless tobacco: Never  Vaping Use   Vaping status: Never Used  Substance Use Topics   Alcohol use: Not Currently   Drug use: No   Current Outpatient Medications  Medication Sig Dispense Refill   norethindrone -ethinyl estradiol  (LOESTRIN ) 1-20 MG-MCG tablet Take 1 tablet by mouth continously, active pills only 84 tablet 3   ondansetron  (ZOFRAN ) 4 MG tablet Take 1 tablet (4 mg total) by mouth every 8 (eight) hours as needed for nausea or vomiting. (Patient not taking: Reported on 01/29/2024) 30 tablet 0   No current facility-administered medications for this visit.   Allergies  Allergen Reactions   Metronidazole  Rash   Tetracyclines & Related Rash   Augmentin  [Amoxicillin -Pot Clavulanate]     rash   Drug [Tape] Rash   Latex Rash   Wound Dressing Adhesive Dermatitis, Itching and Rash     Review of Systems: All systems reviewed and negative except where noted in HPI.    No results found.  Physical Exam: BP 122/68   Pulse 83   Ht 5\' 7"  (1.702 m)   Wt 138 lb (62.6 kg)   BMI 21.61 kg/m  Constitutional: Pleasant,well-developed, Caucasian female in no acute distress. HEENT: Normocephalic and atraumatic. Conjunctivae are normal. No scleral icterus. Neck supple.  Cardiovascular: Normal rate, regular rhythm.  Pulmonary/chest: Effort normal and breath sounds normal. No wheezing, rales or rhonchi. Abdominal: Soft, nondistended, nontender. Bowel sounds active throughout. There are no masses palpable. No hepatomegaly. Extremities: no edema Neurological: Alert and  oriented to person place and time. Skin: Skin is warm and dry. No rashes noted. Psychiatric: Normal mood and affect. Behavior is normal.  CBC    Component Value Date/Time   WBC 6.2 11/01/2022 1409   RBC 4.00 11/01/2022 1409   HGB 12.8 11/01/2022 1409   HCT 37.0 11/01/2022 1409   PLT 318.0 11/01/2022 1409   MCV 92.5 11/01/2022 1409   MCH 33.3 03/15/2020 0459   MCHC 34.7 11/01/2022 1409   RDW 12.1 11/01/2022 1409   LYMPHSABS 1.8 11/01/2022 1409   MONOABS 0.7 11/01/2022 1409   EOSABS 0.2 11/01/2022 1409   BASOSABS 0.0 11/01/2022 1409    CMP     Component Value Date/Time   NA 138 11/06/2023 1002   K 4.0 11/06/2023 1002   CL 105 11/06/2023 1002   CO2 27 11/06/2023 1002   GLUCOSE 87 11/06/2023 1002   BUN 8 11/06/2023 1002   CREATININE 0.56 11/06/2023 1002   CALCIUM 9.4 11/06/2023 1002   PROT 7.5 11/06/2023 1002   ALBUMIN 4.7 11/06/2023 1002   AST 12 11/06/2023 1002   ALT 9 11/06/2023 1002   ALKPHOS 36 (L) 11/06/2023 1002   BILITOT 1.2 11/06/2023 1002   GFRNONAA >60 03/15/2020 0831   GFRAA >60 03/15/2020 0831       Latest Ref Rng & Units 11/01/2022    2:09 PM 10/27/2020    3:49 PM 03/15/2020    4:59 AM  CBC EXTENDED  WBC 4.0 - 10.5 K/uL 6.2  8.0  13.0   RBC 3.87 - 5.11 Mil/uL 4.00  4.00  3.30   Hemoglobin 12.0 - 15.0 g/dL 52.8  41.3  24.4   HCT 36.0 - 46.0 % 37.0  37.1  32.5   Platelets 150.0 - 400.0 K/uL 318.0  304.0  222   NEUT# 1.4 - 7.7 K/uL 3.4  4.6    Lymph# 0.7 - 4.0 K/uL 1.8  2.7        ASSESSMENT AND PLAN:     Helicobacter pylori infection Completed 8-days of intended 14 day course of quadruple therapy with symptom improvement.  Hopefully, the bacteria was eradicated with the abbreviated course of therapy.  Will check a stool antigen to assess for presence of the bacteria.  If negative, would not recommend any further testing or treatment.  If positive, we will need to consider alternative treatment regiments for H. pylori.  All standard H. pylori  regimens involve amoxicillin , doxycycline/tetracycline  or metronidazole .  We cannot know whether the patient had a reaction to the metronidazole  or the tetracycline  as she was taking them at the same time.  Although testing is available for penicillin allergies, I do not know if such testing assess for tetracycline  or metronidazole . - Order stool test for H. pylori. - Explore alternative/non-standard regimens if H. pylori persists.  Allergic reaction to antibiotics Developed rash, headache, and fever on day 8 of therapy, resolved with prednisone . Uncertain if reaction was due to tetracycline  or metronidazole .  Previously experienced  a similar rash the day after completing a 7-day course of amoxicillin .      Olie Dibert E. Cherryl Corona, MD [facility]   Dorrene Gaucher, NP

## 2024-01-29 NOTE — Patient Instructions (Addendum)
 Your provider has requested that you go to the basement level for lab work before leaving today. Press "B" on the elevator. The lab is located at the first door on the left as you exit the elevator.  _______________________________________________________  If your blood pressure at your visit was 140/90 or greater, please contact your primary care physician to follow up on this.  _______________________________________________________  If you are age 34 or older, your body mass index should be between 23-30. Your Body mass index is 21.61 kg/m. If this is out of the aforementioned range listed, please consider follow up with your Primary Care Provider.  If you are age 55 or younger, your body mass index should be between 19-25. Your Body mass index is 21.61 kg/m. If this is out of the aformentioned range listed, please consider follow up with your Primary Care Provider.   ________________________________________________________  The [facility] providers would like to encourage you to use MYCHART to communicate with providers for non-urgent requests or questions.  Due to long hold times on the telephone, sending your provider a message by [facility] may be a faster and more efficient way to get a response.  Please allow 48 business hours for a response.  Please remember that this is for non-urgent requests.  _______________________________________________________

## 2024-01-30 ENCOUNTER — Other Ambulatory Visit

## 2024-01-30 DIAGNOSIS — A048 Other specified bacterial intestinal infections: Secondary | ICD-10-CM | POA: Diagnosis not present

## 2024-02-01 ENCOUNTER — Ambulatory Visit: Payer: Self-pay | Admitting: Gastroenterology

## 2024-02-01 LAB — H. PYLORI ANTIGEN, STOOL: H pylori Ag, Stl: NEGATIVE

## 2024-02-01 NOTE — Progress Notes (Signed)
 Great news!  The bacteria was eradicated with the shortened course of antibiotics.  No further testing or treatment is recommended.

## 2024-04-01 ENCOUNTER — Other Ambulatory Visit (HOSPITAL_COMMUNITY): Payer: Self-pay

## 2024-04-01 ENCOUNTER — Other Ambulatory Visit: Payer: Self-pay | Admitting: Medical Genetics

## 2024-04-01 ENCOUNTER — Telehealth: Admitting: Physician Assistant

## 2024-04-01 DIAGNOSIS — L239 Allergic contact dermatitis, unspecified cause: Secondary | ICD-10-CM

## 2024-04-01 MED ORDER — TRIAMCINOLONE ACETONIDE 0.1 % EX CREA
1.0000 | TOPICAL_CREAM | Freq: Two times a day (BID) | CUTANEOUS | 0 refills | Status: AC
Start: 2024-04-01 — End: ?
  Filled 2024-04-01: qty 30, 15d supply, fill #0

## 2024-04-01 MED ORDER — PREDNISONE 10 MG (21) PO TBPK
ORAL_TABLET | ORAL | 0 refills | Status: AC
Start: 2024-04-01 — End: ?
  Filled 2024-04-01: qty 21, 6d supply, fill #0

## 2024-04-01 NOTE — Progress Notes (Signed)
 E Visit for Rash  We are sorry that you are not feeling well. Here is how we plan to help!  Based on what you shared with me it looks like you have contact dermatitis.  Contact dermatitis is a skin rash caused by something that touches the skin and causes irritation or inflammation.  Your skin may be red, swollen, dry, cracked, and itch.  The rash should go away in a few days but can last a few weeks.  If you get a rash, it's important to figure out what caused it so the irritant can be avoided in the future. and I am prescribing triamcinolone 0.1 % cream -- apply to the affected area(s) in a thin layer, twice daily for up to 14 days. Do not apply to face, privates or armpit regions.    I have also prescribed: Prednisone 10 mg daily for 6 days (see taper instructions below)  Directions for 6 day taper: Day 1: 2 tablets before breakfast, 1 after both lunch & dinner and 2 at bedtime Day 2: 1 tab before breakfast, 1 after both lunch & dinner and 2 at bedtime Day 3: 1 tab at each meal & 1 at bedtime Day 4: 1 tab at breakfast, 1 at lunch, 1 at bedtime Day 5: 1 tab at breakfast & 1 tab at bedtime Day 6: 1 tab at breakfast   HOME CARE:  Take cool showers and avoid direct sunlight. Apply cool compress or wet dressings. Take a bath in an oatmeal bath.  Sprinkle content of one Aveeno packet under running faucet with comfortably warm water.  Bathe for 15-20 minutes, 1-2 times daily.  Pat dry with a towel. Do not rub the rash. Use hydrocortisone cream. Take an antihistamine like Benadryl for widespread rashes that itch.  The adult dose of Benadryl is 25-50 mg by mouth 4 times daily. Caution:  This type of medication may cause sleepiness.  Do not drink alcohol, drive, or operate dangerous machinery while taking antihistamines.  Do not take these medications if you have prostate enlargement.  Read package instructions thoroughly on all medications that you take.  GET HELP RIGHT AWAY IF:  Symptoms  don't go away after treatment. Severe itching that persists. If you rash spreads or swells. If you rash begins to smell. If it blisters and opens or develops a yellow-brown crust. You develop a fever. You have a sore throat. You become short of breath.  MAKE SURE YOU:  Understand these instructions. Will watch your condition. Will get help right away if you are not doing well or get worse.  Thank you for choosing an e-visit.  Your e-visit answers were reviewed by a board certified advanced clinical practitioner to complete your personal care plan. Depending upon the condition, your plan could have included both over the counter or prescription medications.  Please review your pharmacy choice. Make sure the pharmacy is open so you can pick up prescription now. If there is a problem, you may contact your provider through [employer] and have the prescription routed to another pharmacy.  Your safety is important to us . If you have drug allergies check your prescription carefully.   For the next 24 hours you can use MyChart to ask questions about today's visit, request a non-urgent call back, or ask for a work or school excuse. You will get an email in the next two days asking about your experience. I hope that your e-visit has been valuable and will speed your recovery.  I have spent 5 minutes in review of e-visit questionnaire, review and updating patient chart, medical decision making and response to patient.   Delon CHRISTELLA Dickinson, PA-C

## 2024-04-25 DIAGNOSIS — H52222 Regular astigmatism, left eye: Secondary | ICD-10-CM | POA: Diagnosis not present

## 2024-04-25 DIAGNOSIS — H5211 Myopia, right eye: Secondary | ICD-10-CM | POA: Diagnosis not present

## 2024-05-08 ENCOUNTER — Other Ambulatory Visit (HOSPITAL_COMMUNITY)

## 2024-05-29 ENCOUNTER — Other Ambulatory Visit: Payer: Self-pay

## 2024-05-29 ENCOUNTER — Other Ambulatory Visit (HOSPITAL_COMMUNITY): Payer: Self-pay

## 2024-05-29 ENCOUNTER — Encounter (HOSPITAL_COMMUNITY): Payer: Self-pay | Admitting: Pharmacist

## 2024-05-31 ENCOUNTER — Other Ambulatory Visit: Payer: Self-pay | Admitting: *Deleted

## 2024-05-31 DIAGNOSIS — Z006 Encounter for examination for normal comparison and control in clinical research program: Secondary | ICD-10-CM

## 2024-06-08 ENCOUNTER — Other Ambulatory Visit (HOSPITAL_BASED_OUTPATIENT_CLINIC_OR_DEPARTMENT_OTHER): Payer: Self-pay

## 2024-06-08 MED ORDER — FLUZONE 0.5 ML IM SUSY
0.5000 mL | PREFILLED_SYRINGE | Freq: Once | INTRAMUSCULAR | 0 refills | Status: AC
  Filled 2024-06-08: qty 0.5, 1d supply, fill #0

## 2024-07-09 LAB — GENECONNECT MOLECULAR SCREEN: Genetic Analysis Overall Interpretation: NEGATIVE
